# Patient Record
Sex: Male | Born: 1970 | Race: White | Hispanic: No | Smoking: Current every day smoker
Health system: Southern US, Community
[De-identification: ages and names within clinical notes are randomized; demographics above are authoritative.]

## PROBLEM LIST (undated history)

## (undated) DIAGNOSIS — J449 Chronic obstructive pulmonary disease, unspecified: Secondary | ICD-10-CM

## (undated) DIAGNOSIS — K089 Disorder of teeth and supporting structures, unspecified: Secondary | ICD-10-CM

## (undated) DIAGNOSIS — K219 Gastro-esophageal reflux disease without esophagitis: Secondary | ICD-10-CM

## (undated) DIAGNOSIS — M549 Dorsalgia, unspecified: Secondary | ICD-10-CM

## (undated) DIAGNOSIS — M8700 Idiopathic aseptic necrosis of unspecified bone: Secondary | ICD-10-CM

## (undated) DIAGNOSIS — J45909 Unspecified asthma, uncomplicated: Secondary | ICD-10-CM

## (undated) DIAGNOSIS — G8929 Other chronic pain: Secondary | ICD-10-CM

## (undated) DIAGNOSIS — I10 Essential (primary) hypertension: Secondary | ICD-10-CM

## (undated) DIAGNOSIS — F419 Anxiety disorder, unspecified: Secondary | ICD-10-CM

## (undated) DIAGNOSIS — N200 Calculus of kidney: Secondary | ICD-10-CM

## (undated) DIAGNOSIS — M719 Bursopathy, unspecified: Secondary | ICD-10-CM

## (undated) DIAGNOSIS — R51 Headache: Secondary | ICD-10-CM

## (undated) DIAGNOSIS — G709 Myoneural disorder, unspecified: Secondary | ICD-10-CM

## (undated) DIAGNOSIS — M1990 Unspecified osteoarthritis, unspecified site: Secondary | ICD-10-CM

## (undated) DIAGNOSIS — R519 Headache, unspecified: Secondary | ICD-10-CM

## (undated) DIAGNOSIS — C801 Malignant (primary) neoplasm, unspecified: Secondary | ICD-10-CM

## (undated) DIAGNOSIS — G43909 Migraine, unspecified, not intractable, without status migrainosus: Secondary | ICD-10-CM

## (undated) DIAGNOSIS — Z87442 Personal history of urinary calculi: Secondary | ICD-10-CM

## (undated) DIAGNOSIS — R7303 Prediabetes: Secondary | ICD-10-CM

## (undated) HISTORY — PX: WISDOM TOOTH EXTRACTION: SHX21

## (undated) HISTORY — PX: HERNIA REPAIR: SHX51

---

## 2002-02-03 ENCOUNTER — Inpatient Hospital Stay (HOSPITAL_COMMUNITY): Admission: EM | Admit: 2002-02-03 | Discharge: 2002-02-05 | Payer: Self-pay | Admitting: Emergency Medicine

## 2002-02-03 ENCOUNTER — Encounter: Payer: Self-pay | Admitting: Emergency Medicine

## 2003-12-14 ENCOUNTER — Emergency Department (HOSPITAL_COMMUNITY): Admission: EM | Admit: 2003-12-14 | Discharge: 2003-12-14 | Payer: Self-pay | Admitting: Emergency Medicine

## 2012-07-18 ENCOUNTER — Encounter (HOSPITAL_COMMUNITY): Payer: Self-pay | Admitting: *Deleted

## 2012-07-18 ENCOUNTER — Emergency Department (HOSPITAL_COMMUNITY)
Admission: EM | Admit: 2012-07-18 | Discharge: 2012-07-18 | Disposition: A | Discharge status: Home / Self Care | Payer: Self-pay | Attending: Emergency Medicine | Admitting: Emergency Medicine

## 2012-07-18 DIAGNOSIS — J020 Streptococcal pharyngitis: Secondary | ICD-10-CM | POA: Insufficient documentation

## 2012-07-18 DIAGNOSIS — R509 Fever, unspecified: Secondary | ICD-10-CM | POA: Insufficient documentation

## 2012-07-18 DIAGNOSIS — H9209 Otalgia, unspecified ear: Secondary | ICD-10-CM | POA: Insufficient documentation

## 2012-07-18 DIAGNOSIS — H9202 Otalgia, left ear: Secondary | ICD-10-CM

## 2012-07-18 DIAGNOSIS — IMO0001 Reserved for inherently not codable concepts without codable children: Secondary | ICD-10-CM | POA: Insufficient documentation

## 2012-07-18 DIAGNOSIS — R599 Enlarged lymph nodes, unspecified: Secondary | ICD-10-CM | POA: Insufficient documentation

## 2012-07-18 DIAGNOSIS — R1310 Dysphagia, unspecified: Secondary | ICD-10-CM | POA: Insufficient documentation

## 2012-07-18 LAB — RAPID STREP SCREEN (MED CTR MEBANE ONLY): Streptococcus, Group A Screen (Direct): NEGATIVE

## 2012-07-18 MED ORDER — ANTIPYRINE-BENZOCAINE 5.4-1.4 % OT SOLN: 3.0000 [drp] | OTIC | Status: DC | PRN

## 2012-07-18 MED ORDER — AMOXICILLIN 500 MG PO CAPS: 500.0000 mg | ORAL_CAPSULE | Freq: Three times a day (TID) | ORAL | Status: DC

## 2012-07-18 NOTE — ED Provider Notes (Signed)
History    This chart was scribed for non-physician practitioner working with Carleene Cooper III, MD by Sofie Rower, ED Scribe. This patient was seen in room TR09C/TR09C and the patient's care was started at 9:48PM.   CSN: [id number]  Arrival date & time 07/18/12  2107   First MD Initiated Contact with Patient 07/18/12 2148      Chief Complaint  Patient presents with  . Sore Throat    (Consider location/radiation/quality/duration/timing/severity/associated sxs/prior treatment) Patient is a 42 y.o. male presenting with pharyngitis. The history is provided by the patient. No language interpreter was used.  Sore Throat This is a new problem. The current episode started 2 days ago. The problem occurs constantly. The problem has been gradually worsening. Pertinent negatives include no chest pain, no headaches and no shortness of breath. Nothing aggravates the symptoms. Nothing relieves the symptoms. He has tried nothing for the symptoms. The treatment provided no relief.    Edward Kelley is a 42 y.o. male , with no known medical hx, who presents to the Emergency Department complaining of sudden, progressively worsening, sore throat, onset two days ago (07/16/12).  Pt chose not to speak during our discussion gesturing that the pain was too severe and speaking only in a whisper when prompted. He would nod yes and no and write on a piece of paper to answer questions. Associated symptoms include left sided otalgia, chills, subjective fever, and myalgias. The pt reports he has been experiencing a progressively worsening sore throat for the past two days, which has prompted his concern and desire to seek medical evaluation for possible strep throat at [facility] this evening (07/18/12). Pt took no interventions.   The pt is a current everyday smoker.   Pt does not have a PCP.    History reviewed. No pertinent past medical history.  No past surgical history on file.  No family history on file.  History   Substance Use Topics  . Smoking status: Not on file  . Smokeless tobacco: Not on file  . Alcohol Use: Not on file      Review of Systems  Constitutional: Negative for fever, chills and diaphoresis.  HENT: Positive for ear pain, sore throat and trouble swallowing. Negative for congestion, rhinorrhea, neck pain and neck stiffness.        Left sided ear pain  Eyes: Negative for visual disturbance.  Respiratory: Negative for apnea, chest tightness and shortness of breath.   Cardiovascular: Negative for chest pain and palpitations.  Gastrointestinal: Negative for nausea, vomiting, diarrhea and constipation.  Genitourinary: Negative for dysuria.  Musculoskeletal: Negative for gait problem.  Skin: Negative for rash.  Neurological: Negative for dizziness, weakness, light-headedness, numbness and headaches.    Allergies  Review of patient's allergies indicates no known allergies.  Home Medications   Current Outpatient Rx  Name  Route  Sig  Dispense  Refill  . ibuprofen (ADVIL,MOTRIN) 200 MG tablet   Oral   Take 800 mg by mouth daily as needed for pain. For pain           BP 132/94  Pulse 100  Temp(Src) 98.7 F (37.1 C) (Oral)  Resp 20  SpO2 98%  Physical Exam  Nursing note and vitals reviewed. Constitutional: He is oriented to person, place, and time. He appears well-developed and well-nourished. No distress.  HENT:  Head: Normocephalic and atraumatic. No trismus in the jaw.  Right Ear: Tympanic membrane normal.  Left Ear: Tympanic membrane and ear canal normal. There is tenderness.  No drainage. Tympanic membrane is not injected, not erythematous, not retracted and not bulging.  Nose: Right sinus exhibits no maxillary sinus tenderness and no frontal sinus tenderness. Left sinus exhibits no maxillary sinus tenderness and no frontal sinus tenderness.  Mouth/Throat: No edematous. Oropharyngeal exudate, posterior oropharyngeal edema and posterior oropharyngeal erythema  present. No tonsillar abscesses.    Eyes: Conjunctivae and EOM are normal.  Neck: Normal range of motion. Neck supple.  No meningeal signs  Cardiovascular: Normal rate, regular rhythm and normal heart sounds.  Exam reveals no gallop and no friction rub.   No murmur heard. Pulmonary/Chest: Effort normal and breath sounds normal. No respiratory distress. He has no wheezes. He has no rales. He exhibits no tenderness.  Abdominal: Soft. Bowel sounds are normal. He exhibits no distension. There is no tenderness. There is no rebound and no guarding.  Musculoskeletal: Normal range of motion. He exhibits no edema and no tenderness.  Neurological: He is alert and oriented to person, place, and time. No cranial nerve deficit.  Skin: Skin is warm and dry. He is not diaphoretic. No erythema.    ED Course  Procedures (including critical care time)  DIAGNOSTIC STUDIES: Oxygen Saturation is 98% on room air, normal by my interpretation.    COORDINATION OF CARE:  9:55 PM- Treatment plan discussed with patient. Pt agrees with treatment.  Medications - No data to display     Results for orders placed during the hospital encounter of 07/18/12  RAPID STREP SCREEN      Result Value Range   Streptococcus, Group A Screen (Direct) NEGATIVE  NEGATIVE   No results found.    No results found.   1. Strep throat   2. Otalgia of left ear       MDM  Pt afebrile with tonsillar exudate, cervical lymphadenopathy, & dysphagia; clinical diagnosis of strep though rapid strep returned negative. Discussed importance of water rehydration. Presentation non concerning for PTA or infxn spread to soft tissue. No trismus or uvula deviation. Specific return precautions discussed. Pt able to drink water in ED without difficulty with intact air way. Recommended PCP follow up.   I personally performed the services described in this documentation, which was scribed in my presence. The recorded information has been  reviewed and is accurate.     Glade Nurse, PA-C 07/19/12 0022

## 2012-07-18 NOTE — ED Notes (Signed)
The pt has been ill for 2 days chills painful swallowing earache lt ear.  Pt not speaking writin info down on paper

## 2012-07-18 NOTE — ED Notes (Signed)
Pt states sore throat since Thursday, with chills and body aches and left side ear ache. Denies fever, N/V, generalized body aches.

## 2012-07-20 NOTE — ED Provider Notes (Signed)
Medical screening examination/treatment/procedure(s) were performed by non-physician practitioner and as supervising physician I was immediately available for consultation/collaboration.   Carleene Cooper III, MD 07/20/12 [phone]

## 2014-03-16 ENCOUNTER — Emergency Department (HOSPITAL_COMMUNITY)
Admission: EM | Admit: 2014-03-16 | Discharge: 2014-03-16 | Disposition: A | Discharge status: Home / Self Care | Payer: No Typology Code available for payment source | Attending: Emergency Medicine | Admitting: Emergency Medicine

## 2014-03-16 ENCOUNTER — Emergency Department (HOSPITAL_COMMUNITY): Payer: No Typology Code available for payment source

## 2014-03-16 ENCOUNTER — Encounter (HOSPITAL_COMMUNITY): Payer: Self-pay | Admitting: *Deleted

## 2014-03-16 DIAGNOSIS — Y998 Other external cause status: Secondary | ICD-10-CM | POA: Diagnosis not present

## 2014-03-16 DIAGNOSIS — M47812 Spondylosis without myelopathy or radiculopathy, cervical region: Secondary | ICD-10-CM

## 2014-03-16 DIAGNOSIS — S161XXA Strain of muscle, fascia and tendon at neck level, initial encounter: Secondary | ICD-10-CM | POA: Insufficient documentation

## 2014-03-16 DIAGNOSIS — S199XXA Unspecified injury of neck, initial encounter: Secondary | ICD-10-CM | POA: Diagnosis present

## 2014-03-16 DIAGNOSIS — S0990XA Unspecified injury of head, initial encounter: Secondary | ICD-10-CM | POA: Diagnosis not present

## 2014-03-16 DIAGNOSIS — Y9389 Activity, other specified: Secondary | ICD-10-CM | POA: Diagnosis not present

## 2014-03-16 DIAGNOSIS — Y92410 Unspecified street and highway as the place of occurrence of the external cause: Secondary | ICD-10-CM | POA: Diagnosis not present

## 2014-03-16 DIAGNOSIS — S4990XA Unspecified injury of shoulder and upper arm, unspecified arm, initial encounter: Secondary | ICD-10-CM | POA: Insufficient documentation

## 2014-03-16 DIAGNOSIS — Z792 Long term (current) use of antibiotics: Secondary | ICD-10-CM | POA: Insufficient documentation

## 2014-03-16 DIAGNOSIS — M542 Cervicalgia: Secondary | ICD-10-CM

## 2014-03-16 MED ORDER — IBUPROFEN 200 MG PO TABS: 600.0000 mg | ORAL_TABLET | Freq: Every day | ORAL | Status: DC | PRN

## 2014-03-16 MED ORDER — HYDROCODONE-ACETAMINOPHEN 5-325 MG PO TABS: 1.0000 | ORAL_TABLET | Freq: Four times a day (QID) | ORAL | Status: DC | PRN

## 2014-03-16 MED ORDER — DIAZEPAM 5 MG PO TABS: 5.0000 mg | ORAL_TABLET | Freq: Two times a day (BID) | ORAL | Status: DC
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
## 2014-03-16 NOTE — Discharge Instructions (Signed)
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
## 2015-10-12 ENCOUNTER — Emergency Department (HOSPITAL_COMMUNITY): Payer: Self-pay

## 2015-10-12 ENCOUNTER — Encounter (HOSPITAL_COMMUNITY): Payer: Self-pay

## 2015-10-12 ENCOUNTER — Observation Stay (HOSPITAL_COMMUNITY): Payer: Self-pay | Admitting: Anesthesiology

## 2015-10-12 ENCOUNTER — Observation Stay (HOSPITAL_COMMUNITY)
Admission: EM | Admit: 2015-10-12 | Discharge: 2015-10-13 | Disposition: A | Discharge status: Home / Self Care | Payer: Self-pay | Attending: General Surgery | Admitting: General Surgery

## 2015-10-12 ENCOUNTER — Encounter (HOSPITAL_COMMUNITY)
Admission: EM | Disposition: A | Discharge status: Home / Self Care | Payer: Self-pay | Source: Home / Self Care | Attending: Emergency Medicine

## 2015-10-12 DIAGNOSIS — K3580 Unspecified acute appendicitis: Principal | ICD-10-CM | POA: Insufficient documentation

## 2015-10-12 DIAGNOSIS — K219 Gastro-esophageal reflux disease without esophagitis: Secondary | ICD-10-CM | POA: Insufficient documentation

## 2015-10-12 DIAGNOSIS — K047 Periapical abscess without sinus: Secondary | ICD-10-CM | POA: Insufficient documentation

## 2015-10-12 DIAGNOSIS — M1990 Unspecified osteoarthritis, unspecified site: Secondary | ICD-10-CM | POA: Insufficient documentation

## 2015-10-12 DIAGNOSIS — K3589 Other acute appendicitis without perforation or gangrene: Secondary | ICD-10-CM

## 2015-10-12 DIAGNOSIS — J45909 Unspecified asthma, uncomplicated: Secondary | ICD-10-CM | POA: Insufficient documentation

## 2015-10-12 DIAGNOSIS — K029 Dental caries, unspecified: Secondary | ICD-10-CM | POA: Insufficient documentation

## 2015-10-12 DIAGNOSIS — F17210 Nicotine dependence, cigarettes, uncomplicated: Secondary | ICD-10-CM | POA: Insufficient documentation

## 2015-10-12 DIAGNOSIS — F419 Anxiety disorder, unspecified: Secondary | ICD-10-CM | POA: Insufficient documentation

## 2015-10-12 DIAGNOSIS — K429 Umbilical hernia without obstruction or gangrene: Secondary | ICD-10-CM | POA: Insufficient documentation

## 2015-10-12 HISTORY — DX: Other chronic pain: G89.29

## 2015-10-12 HISTORY — PX: LAPAROSCOPIC APPENDECTOMY: SHX408

## 2015-10-12 HISTORY — DX: Dorsalgia, unspecified: M54.9

## 2015-10-12 HISTORY — DX: Migraine, unspecified, not intractable, without status migrainosus: G43.909

## 2015-10-12 HISTORY — DX: Unspecified asthma, uncomplicated: J45.909

## 2015-10-12 HISTORY — DX: Bursopathy, unspecified: M71.9

## 2015-10-12 HISTORY — DX: Headache: R51

## 2015-10-12 HISTORY — DX: Anxiety disorder, unspecified: F41.9

## 2015-10-12 HISTORY — DX: Headache, unspecified: R51.9

## 2015-10-12 HISTORY — DX: Gastro-esophageal reflux disease without esophagitis: K21.9

## 2015-10-12 HISTORY — DX: Unspecified osteoarthritis, unspecified site: M19.90

## 2015-10-12 HISTORY — DX: Calculus of kidney: N20.0

## 2015-10-12 HISTORY — DX: Disorder of teeth and supporting structures, unspecified: K08.9

## 2015-10-12 LAB — URINE MICROSCOPIC-ADD ON

## 2015-10-12 LAB — URINALYSIS, ROUTINE W REFLEX MICROSCOPIC
Bilirubin Urine: NEGATIVE
Glucose, UA: NEGATIVE mg/dL
KETONES UR: NEGATIVE mg/dL
LEUKOCYTES UA: NEGATIVE
Nitrite: NEGATIVE
PH: 6 (ref 5.0–8.0)
Protein, ur: NEGATIVE mg/dL
Specific Gravity, Urine: 1.02 (ref 1.005–1.030)

## 2015-10-12 LAB — COMPREHENSIVE METABOLIC PANEL
ALBUMIN: 4.5 g/dL (ref 3.5–5.0)
ALK PHOS: 57 U/L (ref 38–126)
ALT: 35 U/L (ref 17–63)
ANION GAP: 14 (ref 5–15)
AST: 26 U/L (ref 15–41)
BUN: 7 mg/dL (ref 6–20)
CO2: 18 mmol/L — AB (ref 22–32)
Calcium: 9.7 mg/dL (ref 8.9–10.3)
Chloride: 105 mmol/L (ref 101–111)
Creatinine, Ser: 1.13 mg/dL (ref 0.61–1.24)
GFR calc Af Amer: >60 mL/min (ref >60)
GFR calc non Af Amer: >60 mL/min (ref >60)
GLUCOSE: 128 mg/dL — AB (ref 65–99)
Potassium: 4.1 mmol/L (ref 3.5–5.1)
Sodium: 137 mmol/L (ref 135–145)
Total Bilirubin: 0.6 mg/dL (ref 0.3–1.2)
Total Protein: 7.3 g/dL (ref 6.5–8.1)

## 2015-10-12 LAB — CBC WITH DIFFERENTIAL/PLATELET
BASOS PCT: 1 %
Basophils Absolute: 0.1 10*3/uL (ref 0.0–0.1)
EOS ABS: 0.3 10*3/uL (ref 0.0–0.7)
Eosinophils Relative: 3 %
HCT: 48.3 % (ref 39.0–52.0)
HEMOGLOBIN: 16.7 g/dL (ref 13.0–17.0)
Lymphocytes Relative: 20 %
Lymphs Abs: 1.7 10*3/uL (ref 0.7–4.0)
MCH: 29.8 pg (ref 26.0–34.0)
MCHC: 34.6 g/dL (ref 30.0–36.0)
MCV: 86.1 fL (ref 78.0–100.0)
MONOS PCT: 8 %
Monocytes Absolute: 0.7 10*3/uL (ref 0.1–1.0)
NEUTROS PCT: 68 %
Neutro Abs: 5.9 10*3/uL (ref 1.7–7.7)
Platelets: 219 10*3/uL (ref 150–400)
RBC: 5.61 MIL/uL (ref 4.22–5.81)
RDW: 13 % (ref 11.5–15.5)
WBC: 8.7 10*3/uL (ref 4.0–10.5)

## 2015-10-12 LAB — SURGICAL PCR SCREEN
MRSA, PCR: NEGATIVE
Staphylococcus aureus: NEGATIVE

## 2015-10-12 LAB — LIPASE, BLOOD: Lipase: 20 U/L (ref 11–51)

## 2015-10-12 SURGERY — APPENDECTOMY, LAPAROSCOPIC
Anesthesia: General | Site: Abdomen

## 2015-10-12 MED ORDER — HYDROMORPHONE HCL 1 MG/ML IJ SOLN
1.0000 mg | Freq: Once | INTRAMUSCULAR | Status: AC
  Administered 2015-10-12: 1 mg via INTRAVENOUS
  Filled 2015-10-12: qty 1

## 2015-10-12 MED ORDER — SODIUM CHLORIDE 0.9 % IJ SOLN
INTRAMUSCULAR | Status: AC
  Filled 2015-10-12: qty 10

## 2015-10-12 MED ORDER — SUFENTANIL CITRATE 50 MCG/ML IV SOLN
INTRAVENOUS | Status: DC | PRN
  Administered 2015-10-12 (×5): 10 ug via INTRAVENOUS

## 2015-10-12 MED ORDER — IPRATROPIUM BROMIDE 0.02 % IN SOLN
0.5000 mg | Freq: Once | RESPIRATORY_TRACT | Status: AC
  Administered 2015-10-12: 0.5 mg via RESPIRATORY_TRACT
  Filled 2015-10-12: qty 2.5

## 2015-10-12 MED ORDER — ROCURONIUM BROMIDE 50 MG/5ML IV SOLN
INTRAVENOUS | Status: AC
  Filled 2015-10-12: qty 1

## 2015-10-12 MED ORDER — BUPIVACAINE-EPINEPHRINE (PF) 0.25% -1:200000 IJ SOLN
INTRAMUSCULAR | Status: AC
  Filled 2015-10-12: qty 30

## 2015-10-12 MED ORDER — OXYCODONE HCL 5 MG PO TABS: 5.0000 mg | ORAL_TABLET | Freq: Once | ORAL | Status: DC | PRN

## 2015-10-12 MED ORDER — METRONIDAZOLE IN NACL 5-0.79 MG/ML-% IV SOLN
500.0000 mg | Freq: Once | INTRAVENOUS | Status: AC
  Administered 2015-10-12: 500 mg via INTRAVENOUS
  Filled 2015-10-12: qty 100

## 2015-10-12 MED ORDER — BUPIVACAINE-EPINEPHRINE 0.25% -1:200000 IJ SOLN
INTRAMUSCULAR | Status: DC | PRN
  Administered 2015-10-12: 20 mL

## 2015-10-12 MED ORDER — OXYCODONE HCL 5 MG/5ML PO SOLN: 5.0000 mg | Freq: Once | ORAL | Status: DC | PRN

## 2015-10-12 MED ORDER — LIDOCAINE 2% (20 MG/ML) 5 ML SYRINGE
INTRAMUSCULAR | Status: AC
  Filled 2015-10-12: qty 5

## 2015-10-12 MED ORDER — ONDANSETRON HCL 4 MG/2ML IJ SOLN: 4.0000 mg | Freq: Four times a day (QID) | INTRAMUSCULAR | Status: DC | PRN

## 2015-10-12 MED ORDER — NICOTINE 21 MG/24HR TD PT24
21.0000 mg | MEDICATED_PATCH | Freq: Every day | TRANSDERMAL | Status: DC
  Administered 2015-10-13: 21 mg via TRANSDERMAL
  Filled 2015-10-12: qty 1

## 2015-10-12 MED ORDER — SODIUM CHLORIDE 0.9 % IR SOLN
Status: DC | PRN
  Administered 2015-10-12: 1000 mL

## 2015-10-12 MED ORDER — PROPOFOL 10 MG/ML IV BOLUS
INTRAVENOUS | Status: AC
  Filled 2015-10-12: qty 20

## 2015-10-12 MED ORDER — SUGAMMADEX SODIUM 200 MG/2ML IV SOLN
INTRAVENOUS | Status: DC | PRN
  Administered 2015-10-12: 200 mg via INTRAVENOUS

## 2015-10-12 MED ORDER — IPRATROPIUM-ALBUTEROL 20-100 MCG/ACT IN AERS
INHALATION_SPRAY | RESPIRATORY_TRACT | Status: DC | PRN
  Administered 2015-10-12 (×2): 2 via RESPIRATORY_TRACT

## 2015-10-12 MED ORDER — PNEUMOCOCCAL VAC POLYVALENT 25 MCG/0.5ML IJ INJ: 0.5000 mL | INJECTION | INTRAMUSCULAR | Status: DC

## 2015-10-12 MED ORDER — DEXTROSE 5 % IV SOLN
2.0000 g | Freq: Once | INTRAVENOUS | Status: AC
  Administered 2015-10-12: 2 g via INTRAVENOUS
  Filled 2015-10-12: qty 2

## 2015-10-12 MED ORDER — SODIUM CHLORIDE 0.9 % IV SOLN
INTRAVENOUS | Status: DC
Start: 2015-10-12 — End: 2015-10-13
  Administered 2015-10-12 – 2015-10-13 (×2): via INTRAVENOUS

## 2015-10-12 MED ORDER — MIDAZOLAM HCL 2 MG/2ML IJ SOLN
INTRAMUSCULAR | Status: DC | PRN
  Administered 2015-10-12: 2 mg via INTRAVENOUS

## 2015-10-12 MED ORDER — 0.9 % SODIUM CHLORIDE (POUR BTL) OPTIME
TOPICAL | Status: DC | PRN
  Administered 2015-10-12: 200 mL

## 2015-10-12 MED ORDER — IPRATROPIUM-ALBUTEROL 0.5-2.5 (3) MG/3ML IN SOLN: 3.0000 mL | Freq: Four times a day (QID) | RESPIRATORY_TRACT | Status: DC | PRN

## 2015-10-12 MED ORDER — ROCURONIUM BROMIDE 50 MG/5ML IV SOLN
INTRAVENOUS | Status: AC
Start: 2015-10-12 — End: 2015-10-12
  Filled 2015-10-12: qty 1

## 2015-10-12 MED ORDER — LACTATED RINGERS IV SOLN
INTRAVENOUS | Status: DC | PRN
  Administered 2015-10-12 (×3): via INTRAVENOUS

## 2015-10-12 MED ORDER — ROCURONIUM BROMIDE 100 MG/10ML IV SOLN
INTRAVENOUS | Status: DC | PRN
  Administered 2015-10-12: 30 mg via INTRAVENOUS
  Administered 2015-10-12: 20 mg via INTRAVENOUS

## 2015-10-12 MED ORDER — ONDANSETRON 4 MG PO TBDP: 4.0000 mg | ORAL_TABLET | Freq: Four times a day (QID) | ORAL | Status: DC | PRN

## 2015-10-12 MED ORDER — ALBUTEROL SULFATE (2.5 MG/3ML) 0.083% IN NEBU
5.0000 mg | INHALATION_SOLUTION | Freq: Once | RESPIRATORY_TRACT | Status: AC
  Administered 2015-10-12: 5 mg via RESPIRATORY_TRACT
  Filled 2015-10-12: qty 6

## 2015-10-12 MED ORDER — SUCCINYLCHOLINE CHLORIDE 20 MG/ML IJ SOLN
INTRAMUSCULAR | Status: DC | PRN
  Administered 2015-10-12: 120 mg via INTRAVENOUS

## 2015-10-12 MED ORDER — ALBUTEROL SULFATE HFA 108 (90 BASE) MCG/ACT IN AERS
INHALATION_SPRAY | RESPIRATORY_TRACT | Status: AC
  Filled 2015-10-12: qty 6.7

## 2015-10-12 MED ORDER — LIDOCAINE HCL (CARDIAC) 20 MG/ML IV SOLN
INTRAVENOUS | Status: DC | PRN
  Administered 2015-10-12: 60 mg via INTRATRACHEAL

## 2015-10-12 MED ORDER — SUGAMMADEX SODIUM 200 MG/2ML IV SOLN
INTRAVENOUS | Status: AC
  Filled 2015-10-12: qty 2

## 2015-10-12 MED ORDER — SUFENTANIL CITRATE 50 MCG/ML IV SOLN
INTRAVENOUS | Status: AC
  Filled 2015-10-12: qty 1

## 2015-10-12 MED ORDER — HYDROMORPHONE HCL 1 MG/ML IJ SOLN
1.0000 mg | INTRAMUSCULAR | Status: DC | PRN
  Administered 2015-10-12 (×2): 1 mg via INTRAVENOUS
  Filled 2015-10-12 (×2): qty 1

## 2015-10-12 MED ORDER — SUCCINYLCHOLINE CHLORIDE 200 MG/10ML IV SOSY
PREFILLED_SYRINGE | INTRAVENOUS | Status: AC
  Filled 2015-10-12: qty 10

## 2015-10-12 MED ORDER — MIDAZOLAM HCL 2 MG/2ML IJ SOLN
INTRAMUSCULAR | Status: AC
  Filled 2015-10-12: qty 2

## 2015-10-12 MED ORDER — ONDANSETRON HCL 4 MG/2ML IJ SOLN
INTRAMUSCULAR | Status: DC | PRN
  Administered 2015-10-12: 4 mg via INTRAVENOUS

## 2015-10-12 MED ORDER — ONDANSETRON HCL 4 MG/2ML IJ SOLN
4.0000 mg | Freq: Once | INTRAMUSCULAR | Status: AC
  Administered 2015-10-12: 4 mg via INTRAVENOUS
  Filled 2015-10-12: qty 2

## 2015-10-12 MED ORDER — HYDROMORPHONE HCL 1 MG/ML IJ SOLN
0.2500 mg | INTRAMUSCULAR | Status: DC | PRN
  Administered 2015-10-12 (×2): 0.5 mg via INTRAVENOUS

## 2015-10-12 MED ORDER — HYDROMORPHONE HCL 1 MG/ML IJ SOLN
INTRAMUSCULAR | Status: AC
  Administered 2015-10-12: 0.5 mg via INTRAVENOUS
  Filled 2015-10-12: qty 1

## 2015-10-12 MED ORDER — PROPOFOL 10 MG/ML IV BOLUS
INTRAVENOUS | Status: DC | PRN
  Administered 2015-10-12: 200 mg via INTRAVENOUS

## 2015-10-12 MED ORDER — HYDROMORPHONE HCL 1 MG/ML IJ SOLN
0.5000 mg | Freq: Once | INTRAMUSCULAR | Status: AC
  Administered 2015-10-12: 0.5 mg via INTRAVENOUS
  Filled 2015-10-12: qty 1

## 2015-10-12 MED ORDER — ONDANSETRON HCL 4 MG/2ML IJ SOLN
INTRAMUSCULAR | Status: AC
  Filled 2015-10-12: qty 2

## 2015-10-12 SURGICAL SUPPLY — 59 items
ADH SKN CLS APL DERMABOND .7 (GAUZE/BANDAGES/DRESSINGS)
APL SKNCLS STERI-STRIP NONHPOA (GAUZE/BANDAGES/DRESSINGS) ×1
APPLIER CLIP 5 13 M/L LIGAMAX5 (MISCELLANEOUS) ×3
APPLIER CLIP ROT 10 11.4 M/L (STAPLE)
APR CLP MED LRG 11.4X10 (STAPLE)
APR CLP MED LRG 5 ANG JAW (MISCELLANEOUS) ×1
BAG SPEC RTRVL 10 TROC 200 (ENDOMECHANICALS) ×1
BANDAGE ADH SHEER 1  50/CT (GAUZE/BANDAGES/DRESSINGS) IMPLANT
BENZOIN TINCTURE PRP APPL 2/3 (GAUZE/BANDAGES/DRESSINGS) ×2 IMPLANT
BLADE SURG ROTATE 9660 (MISCELLANEOUS) IMPLANT
CANISTER SUCTION 2500CC (MISCELLANEOUS) ×3 IMPLANT
CHLORAPREP W/TINT 26ML (MISCELLANEOUS) ×3 IMPLANT
CLIP APPLIE 5 13 M/L LIGAMAX5 (MISCELLANEOUS) IMPLANT
CLIP APPLIE ROT 10 11.4 M/L (STAPLE) IMPLANT
CLOSURE WOUND 1/2 X4 (GAUZE/BANDAGES/DRESSINGS)
COVER SURGICAL LIGHT HANDLE (MISCELLANEOUS) ×3 IMPLANT
CUTTER FLEX LINEAR 45M (STAPLE) ×3 IMPLANT
DERMABOND ADVANCED (GAUZE/BANDAGES/DRESSINGS)
DERMABOND ADVANCED .7 DNX12 (GAUZE/BANDAGES/DRESSINGS) IMPLANT
DRSG TEGADERM 4X4.75 (GAUZE/BANDAGES/DRESSINGS) ×2 IMPLANT
ELECT REM PT RETURN 9FT ADLT (ELECTROSURGICAL) ×3
ELECTRODE REM PT RTRN 9FT ADLT (ELECTROSURGICAL) ×1 IMPLANT
ENDOLOOP SUT PDS II  0 18 (SUTURE)
ENDOLOOP SUT PDS II 0 18 (SUTURE) IMPLANT
GAUZE SPONGE 2X2 8PLY STRL LF (GAUZE/BANDAGES/DRESSINGS) IMPLANT
GLOVE BIOGEL M STRL SZ7.5 (GLOVE) ×3 IMPLANT
GLOVE BIOGEL PI IND STRL 8 (GLOVE) ×1 IMPLANT
GLOVE BIOGEL PI INDICATOR 8 (GLOVE) ×2
GOWN STRL REUS W/ TWL LRG LVL3 (GOWN DISPOSABLE) ×2 IMPLANT
GOWN STRL REUS W/ TWL XL LVL3 (GOWN DISPOSABLE) ×1 IMPLANT
GOWN STRL REUS W/TWL LRG LVL3 (GOWN DISPOSABLE) ×6
GOWN STRL REUS W/TWL XL LVL3 (GOWN DISPOSABLE) ×3
KIT BASIN OR (CUSTOM PROCEDURE TRAY) ×3 IMPLANT
KIT ROOM TURNOVER OR (KITS) ×3 IMPLANT
NS IRRIG 1000ML POUR BTL (IV SOLUTION) ×3 IMPLANT
PAD ARMBOARD 7.5X6 YLW CONV (MISCELLANEOUS) ×6 IMPLANT
POUCH RETRIEVAL ECOSAC 10 (ENDOMECHANICALS) ×1 IMPLANT
POUCH RETRIEVAL ECOSAC 10MM (ENDOMECHANICALS) ×2
RELOAD 45 VASCULAR/THIN (ENDOMECHANICALS) IMPLANT
RELOAD STAPLE 45 2.5 WHT GRN (ENDOMECHANICALS) IMPLANT
RELOAD STAPLE 45 3.5 BLU ETS (ENDOMECHANICALS) IMPLANT
RELOAD STAPLE TA45 3.5 REG BLU (ENDOMECHANICALS) ×3 IMPLANT
SCALPEL HARMONIC ACE (MISCELLANEOUS) ×3 IMPLANT
SCISSORS LAP 5X35 DISP (ENDOMECHANICALS) IMPLANT
SET IRRIG TUBING LAPAROSCOPIC (IRRIGATION / IRRIGATOR) ×3 IMPLANT
SPECIMEN JAR SMALL (MISCELLANEOUS) ×3 IMPLANT
SPONGE GAUZE 2X2 STER 10/PKG (GAUZE/BANDAGES/DRESSINGS) ×2
STRIP CLOSURE SKIN 1/2X4 (GAUZE/BANDAGES/DRESSINGS) IMPLANT
SUT MNCRL AB 4-0 PS2 18 (SUTURE) ×3 IMPLANT
SUT NOVA NAB GS-21 1 T12 (SUTURE) ×2 IMPLANT
SUT VIC AB 3-0 SH 8-18 (SUTURE) ×2 IMPLANT
SUT VICRYL 0 UR6 27IN ABS (SUTURE) IMPLANT
TOWEL OR 17X24 6PK STRL BLUE (TOWEL DISPOSABLE) ×3 IMPLANT
TOWEL OR 17X26 10 PK STRL BLUE (TOWEL DISPOSABLE) ×3 IMPLANT
TRAY FOLEY CATH 16FR SILVER (SET/KITS/TRAYS/PACK) ×3 IMPLANT
TRAY LAPAROSCOPIC MC (CUSTOM PROCEDURE TRAY) ×3 IMPLANT
TROCAR XCEL BLADELESS 5X75MML (TROCAR) ×6 IMPLANT
TROCAR XCEL BLUNT TIP 100MML (ENDOMECHANICALS) ×3 IMPLANT
TUBING INSUFFLATION (TUBING) ×3 IMPLANT

## 2015-10-12 NOTE — Anesthesia Procedure Notes (Signed)
Procedure Name: Intubation Date/Time: 10/12/2015 8:35 PM Performed by: Madai Nuccio S Pre-anesthesia Checklist: Patient identified, Suction available, Emergency Drugs available, Patient being monitored and Timeout performed Patient Re-evaluated:Patient Re-evaluated prior to inductionOxygen Delivery Method: Circle system utilized Preoxygenation: Pre-oxygenation with 100% oxygen Intubation Type: IV induction Ventilation: Mask ventilation without difficulty Laryngoscope Size: Mac and 4 Grade View: Grade I Tube type: Oral Tube size: 7.5 mm Number of attempts: 1 Airway Equipment and Method: Stylet Placement Confirmation: ETT inserted through vocal cords under direct vision,  positive ETCO2 and breath sounds checked- equal and bilateral Secured at: 22 cm Tube secured with: Tape Dental Injury: Teeth and Oropharynx as per pre-operative assessment

## 2015-10-12 NOTE — Transfer of Care (Signed)
Immediate Anesthesia Transfer of Care Note  Patient: Edward Kelley  Procedure(s) Performed: Procedure(s): APPENDECTOMY LAPAROSCOPIC (N/A)  Patient Location: PACU  Anesthesia Type:General  Level of Consciousness: awake, alert  and oriented  Airway & Oxygen Therapy: Patient Spontanous Breathing and Patient connected to face mask oxygen  Post-op Assessment: Report given to RN and Post -op Vital signs reviewed and stable  Post vital signs: Reviewed and stable  Last Vitals:  Vitals:   10/12/15 1719 10/12/15 1956  BP: (!) 153/98 (!) 143/88  Pulse: 85 72  Resp: 18 20  Temp: 36.6 C 37.1 C    Last Pain:  Vitals:   10/12/15 1956  TempSrc: Oral  PainSc:       Patients Stated Pain Goal: 2 (10/12/15 1719)  Complications: No apparent anesthesia complications

## 2015-10-12 NOTE — ED Notes (Signed)
Pt transported to xray 

## 2015-10-12 NOTE — ED Triage Notes (Signed)
Pt c/o right-sided abd pain x 2 days, left sided dental pain x years, and shortness of breath for years but much worse today. Denies n/v/d.

## 2015-10-12 NOTE — Progress Notes (Signed)
Admitted to room 6n23 from ED, denies nausea, c/o abd pain, oriented to room and surroundings, no family with pt.Marland Kitchen

## 2015-10-12 NOTE — Op Note (Signed)
Jaden Abreu [id number] [birth year] 10/12/2015  Appendectomy, Lap, Procedure Note  Indications: The patient presented with a history of right-sided abdominal pain. A CT revealed findings consistent with acute appendicitis.  Pre-operative Diagnosis: Acute appendicitis  Post-operative Diagnosis: Same  Surgeon: Atilano Ina   Assistants: None  Anesthesia: General endotracheal anesthesia  Procedure Details  The patient was seen again in the Holding Room. The risks, benefits, complications, treatment options, and expected outcomes were discussed with the patient and/or family. The possibilities of perforation of viscus, bleeding, recurrent infection, the need for additional procedures, failure to diagnose a condition, and creating a complication requiring transfusion or operation were discussed. There was concurrence with the proposed plan and informed consent was obtained. The site of surgery was properly noted. The patient was taken to Operating Room, identified as Tavin Vernet and the procedure verified as Appendectomy. A Time Out was held and the above information confirmed.  The patient was placed in the supine position and general anesthesia was induced, along with placement of orogastric tube, SCDs, and a Foley catheter. The abdomen was prepped and draped in a sterile fashion. He had a small fascial defect at his umbilicus so a curvilinear infraumbilical incision was made.   The umbilical stalk was elevated, and and lifted off the fascia. This exposed a 1 cm fascial defect. It was slightly enlarged.  A Kelly clamp was used to confirm entrance into the peritoneal cavity.  A pursestring suture was passed around the incision with a 0 Vicryl.  A 12mm Hasson was introduced into the abdomen and the tails of the suture were used to hold the Hasson in place.   The pneumoperitoneum was then established to steady pressure of 15 mmHg.  Additional 5 mm cannulas then placed in the left lower quadrant of  the abdomen and the suprapubic region under direct visualization. A careful evaluation of the entire abdomen was carried out. The patient was placed in Trendelenburg and left lateral decubitus position. The small intestines were retracted in the cephalad and left lateral direction away from the pelvis and right lower quadrant. The patient was found to have an severely inflamed appendix that was retrocecal in nature. It was extending up the right paracolic gutter. There was no evidence of perforation.   some of the overlying peritoneum was incised with harmonic scalpel. I decided to dissect out the appendiceal base first since that was readily accessible. The appendix was divided at its base using an endo-GIA stapler with a blue load. No appendiceal stump was left in place. There was one spot of bleeding along the staple line. It did not respond to harmonic scalpel. Therefore one 5 mm ligament clip was applied with excellent hemostasis.  The appendix was carefully dissected. The appendix was was skeletonized with the harmonic scalpel.    The appendix was removed from the abdomen with an Ecco bag through the umbilical port.  There was no evidence of bleeding, leakage, or complication after division of the appendix. Irrigation was also performed and irrigate suctioned from the abdomen as well.   because he had a pre-existing small umbilical hernia I decided to close the fascia transversely with 3 interrupted #1 Novafil sutures.  The closure was viewed laparoscopically. There was no residual palpable fascial defect.  The umbilical stalk was tacked back down to the fascia with a 3-0 Vicryl suture. The deep dermis of the umbilical incision was closed with interrupted 3-0 Vicryl sutures. All skin wounds were closed with 4-0 Monocryl. Benzoin, Steri-Strips, and sterile  bandages were applied to the skin incisions.  Instrument, sponge, and needle counts were correct at the conclusion of the case.   Findings: The  appendix was found to be inflamed. There were not signs of necrosis.  There was not perforation. There was not abscess formation. It was retrocecal in nature  Estimated Blood Loss:  20cc         Drains: none         Specimens: appendix         Complications:  None; patient tolerated the procedure well.         Disposition: PACU - hemodynamically stable.         Condition: stable  Mary Sella. Andrey Campanile, MD, FACS General, Bariatric, & Minimally Invasive Surgery [facility], [place]   Mary Sella. Andrey Campanile, MD, FACS General, Bariatric, & Minimally Invasive Surgery [facility], [place]

## 2015-10-12 NOTE — ED Provider Notes (Signed)
[facility]-EMERGENCY DEPT Provider Note   CSN: [id number] Arrival date & time: 10/12/15  1103  First Provider Contact:  None       History   Chief Complaint No chief complaint on file.   HPI Edward Kelley is a 45 y.o. male.  The history is provided by the patient.  Abdominal Pain   This is a new problem. The current episode started yesterday. The problem occurs constantly. The problem has been gradually worsening. Associated with: Started spontaneously was not related to eating. The pain is located in the RUQ (Pain radiates around to the right back and right lower abdomen). The quality of the pain is sharp and shooting. The pain is at a severity of 8/10. The pain is severe. Associated symptoms include anorexia. Pertinent negatives include diarrhea, nausea, vomiting and hematuria. Associated symptoms comments: Mild urinary frequency. Patient has a chronic cough which is unchanged but feels more short of breath and pain is worse with deep breathing.. Nothing aggravates the symptoms. Nothing relieves the symptoms. Past medical history comments: No history of gallstones or kidney stones.    Past Medical History:  Diagnosis Date  . Asthma     There are no active problems to display for this patient.   No past surgical history on file.     Home Medications    Prior to Admission medications   Medication Sig Start Date End Date Taking? Authorizing Provider  amoxicillin (AMOXIL) 500 MG capsule Take 1 capsule (500 mg total) by mouth 3 (three) times daily. 07/18/12   Lowell Bouton, PA-C  antipyrine-benzocaine Lyla Son) otic solution Place 3 drops into the left ear every 2 (two) hours as needed for pain. 07/18/12   Lowell Bouton, PA-C  diazepam (VALIUM) 5 MG tablet Take 1 tablet (5 mg total) by mouth 2 (two) times daily. 03/16/14   [name], PA-C  HYDROcodone-acetaminophen (NORCO/VICODIN) 5-325 MG per tablet Take 1-2 tablets by mouth every 6 (six) hours as needed for moderate pain or  severe pain. 03/16/14   [name], PA-C  ibuprofen (ADVIL,MOTRIN) 200 MG tablet Take 3 tablets (600 mg total) by mouth daily as needed. For pain 03/16/14   [name], PA-C    Family History No family history on file.  Social History Social History  Substance Use Topics  . Smoking status: Current Every Day Smoker    Packs/day: 1.00    Types: Cigarettes  . Smokeless tobacco: Never Used  . Alcohol use Yes     Comment: states when he can afford to drink     Allergies   Codeine   Review of Systems Review of Systems  HENT: Positive for dental problem.        Long history of dental caries in several days ago started getting left cheek swelling from a bad left upper tooth  Gastrointestinal: Positive for abdominal pain and anorexia. Negative for diarrhea, nausea and vomiting.  Genitourinary: Negative for hematuria.     Physical Exam Updated Vital Signs BP (!) 153/112 (BP Location: Right Arm)   Pulse 94   Temp 98.5 F (36.9 C) (Oral)   Resp 20   SpO2 98%   Physical Exam  Constitutional: He is oriented to person, place, and time. He appears well-developed and well-nourished. He appears distressed.  Appears uncomfortable  HENT:  Head: Normocephalic and atraumatic.  Mouth/Throat: Oropharynx is clear and moist. Dental abscesses and dental caries present.    Eyes: Conjunctivae and EOM are normal. Pupils are equal, round, and reactive  to light.  Neck: Normal range of motion. Neck supple.  Cardiovascular: Regular rhythm and intact distal pulses.  Tachycardia present.   No murmur heard. Pulmonary/Chest: Effort normal. No respiratory distress. He has wheezes in the right middle field and the right lower field. He has no rales.  Abdominal: Soft. He exhibits no distension. There is tenderness in the right upper quadrant. There is guarding and CVA tenderness. There is no rebound.  Musculoskeletal: Normal range of motion. He exhibits no edema or tenderness.  Neurological: He is  alert and oriented to person, place, and time.  Skin: Skin is warm and dry. No rash noted. No erythema.  Psychiatric: He has a normal mood and affect. His behavior is normal.  Nursing note and vitals reviewed.    ED Treatments / Results  Labs (all labs ordered are listed, but only abnormal results are displayed) Labs Reviewed  COMPREHENSIVE METABOLIC PANEL - Abnormal; Notable for the following:       Result Value   CO2 18 (*)    Glucose, Bld 128 (*)    All other components within normal limits  URINALYSIS, ROUTINE W REFLEX MICROSCOPIC (NOT AT [facility]) - Abnormal; Notable for the following:    Hgb urine dipstick SMALL (*)    All other components within normal limits  URINE MICROSCOPIC-ADD ON - Abnormal; Notable for the following:    Squamous Epithelial / LPF 0-5 (*)    Bacteria, UA FEW (*)    All other components within normal limits  CBC WITH DIFFERENTIAL/PLATELET  LIPASE, BLOOD    EKG  EKG Interpretation  Date/Time:  Thursday October 12 2015 11:13:01 EDT Ventricular Rate:  97 PR Interval:    QRS Duration: 90 QT Interval:  323 QTC Calculation: 411 R Axis:   99 Text Interpretation:  Sinus rhythm Borderline right axis deviation No previous tracing Confirmed by Anitra Lauth  MD, [place] Kievit ([id number]) on 10/12/2015 11:40:38 AM       Radiology Dg Chest 2 View  Result Date: 10/12/2015 CLINICAL DATA:  Shortness of breath, upper abdominal pain EXAM: CHEST  2 VIEW COMPARISON:  None. FINDINGS: Lungs are clear.  No pleural effusion or pneumothorax. The heart is normal in size. Visualized osseous structures are within normal limits. IMPRESSION: No evidence of acute cardiopulmonary disease. Electronically Signed   By: Charline Bills M.D.   On: 10/12/2015 12:49   Ct Renal Stone Study  Result Date: 10/12/2015 CLINICAL DATA:  Initial evaluation for acute right-sided right flank pain since last night. History of gallstones. EXAM: CT ABDOMEN AND PELVIS WITHOUT CONTRAST TECHNIQUE: Multidetector CT  imaging of the abdomen and pelvis was performed following the standard protocol without IV contrast. COMPARISON:  None. FINDINGS: Minimal subsegmental atelectasis seen dependently within the visualized lung bases. 4 mm sub solid nodule present within the right lower lobe (series 4, image 2), indeterminate. Additional 4 mm left lower lobe nodule noted (series 4, image 10). Visualized lungs are otherwise clear. Limited noncontrast evaluation of the liver is unremarkable. Gallbladder within normal limits. No biliary dilatation. Spleen, right adrenal gland, and pancreas demonstrate a normal unenhanced appearance. 6 mm hypodense nodule arising from the left adrenal gland noted (series 2, image 26), indeterminate, but of doubtful clinical significance, and possibly reflecting a small adenoma. Kidneys equal in size without hydronephrosis. Punctate nonobstructive calculus within the lower pole of the right kidney (series 5, image 54). Additional faint calcific densities overlying the renal papilla of both kidneys also suspicious for possible tiny nonobstructive calculi. No radiopaque calculi seen  along the course of either ureter. There is no hydroureter. Stomach within normal limits. No evidence for bowel obstruction. Appendix well visualized in the right lower quadrant, and is retrocecal in location. Appendix is dilated up to 17 mm in diameter with wall thickening and periappendiceal fat stranding, concerning for acute appendicitis. No periappendiceal abscess or evidence for perforation. No other acute inflammatory changes about the bowels. Bladder within normal limits. No layering stones present within the bladder lumen. Prostate normal. No free air or fluid. No pathologically enlarged intra-abdominal or pelvic lymph nodes. Mild aorto bi-iliac atherosclerotic disease. No aneurysm. No acute osseous abnormality. No worrisome lytic or blastic osseous lesions. Few scattered degenerative endplate Schmorl's nodes noted.  IMPRESSION: 1. Findings consistent with acute appendicitis. No evidence for perforation or other complication. 2. Faint punctate calcifications scattered throughout both kidneys, suspicious for possible small nonobstructive renal calculi. No CT evidence for obstructive uropathy. 3. Two 4 mm nodules present within the visualized lung bases as above, indeterminate. No follow-up needed if patient is low-risk (and has no known or suspected primary neoplasm). Non-contrast chest CT can be considered in 12 months if patient is high-risk. This recommendation follows the consensus statement: Guidelines for Management of Incidental Pulmonary Nodules Detected on CT Images:From the Fleischner Society 2017; published online before print (10.1148/[id number]). Electronically Signed   By: Rise Mu M.D.   On: 10/12/2015 13:50    Procedures Procedures (including critical care time)  Medications Ordered in ED Medications  HYDROmorphone (DILAUDID) injection 1 mg (not administered)  ondansetron (ZOFRAN) injection 4 mg (not administered)  0.9 %  sodium chloride infusion (not administered)  albuterol (PROVENTIL) (2.5 MG/3ML) 0.083% nebulizer solution 5 mg (not administered)  ipratropium (ATROVENT) nebulizer solution 0.5 mg (not administered)     Initial Impression / Assessment and Plan / ED Course  I have reviewed the triage vital signs and the nursing notes.  Pertinent labs & imaging results that were available during my care of the patient were reviewed by me and considered in my medical decision making (see chart for details).  Clinical Course   Pt with symptoms concerning for kidney stone on the right with severe pain in the right abd that radiates to the back.  Also concern for possible cholecystitis vs lung pathology.  Patient is having some shortness of breath and has a chronic cough with a history of ongoing tobacco abuse and is supposed to use inhaler but doesn't have the money for it.   Denies infectious sx, and no fever, vomiting or bowel changes.  Low concern for diverticulitis and no risk factors or history suggestive of AAA.  No hx suggestive of GU source (discharge) and otherwise pt is healthy.   Will hydrate, treat pain and ensure no infection with UA, CBC, CMP.  Vision does have wheezing on exam and he was given albuterol and Atrovent.  Secondly patient has swelling and left upper dental pain. He states he has multiple dental caries and needs to see a dentist but doesn't. He started getting facial swelling several days ago and states this is not unusual when one of his teeth starts acting up. That is not why he is here today.  2:10 PM CT is consistent with acute appendicitis. Patients are on antibiotics and surgery consult.   Final Clinical Impressions(s) / ED Diagnoses   Final diagnoses:  Other acute appendicitis  Dental abscess    New Prescriptions New Prescriptions   No medications on file     Gwyneth Sprout, MD 10/12/15  1412  

## 2015-10-12 NOTE — H&P (Signed)
Edward Kelley is an 45 y.o. male.   Chief Complaint: Appendicitis HPI: Edward Kelley was in his usual state of health which included battling chronic dental disease. He went to sleep last night and was awoken at about 0200 with severe right back pain. The pain continued to progress and he came to the ED about 1000. He denies any N/V/fevers. He takes ibuprofen often for his dental disease. A CT scan was positive for an acute retrocecal appendicitis.  Past Medical History:  Diagnosis Date  . Asthma     No past surgical history on file.  No family history on file. Social History:  reports that he has been smoking Cigarettes.  He has been smoking about 1.00 pack per day. He has never used smokeless tobacco. He reports that he drinks alcohol. He reports that he does not use drugs.  Allergies:  Allergies  Allergen Reactions  . Codeine Itching    Results for orders placed or performed during the hospital encounter of 10/12/15 (from the past 48 hour(s))  CBC with Differential/Platelet     Status: None   Collection Time: 10/12/15 11:20 AM  Result Value Ref Range   WBC 8.7 4.0 - 10.5 K/uL   RBC 5.61 4.22 - 5.81 MIL/uL   Hemoglobin 16.7 13.0 - 17.0 g/dL   HCT 48.3 39.0 - 52.0 %   MCV 86.1 78.0 - 100.0 fL   MCH 29.8 26.0 - 34.0 pg   MCHC 34.6 30.0 - 36.0 g/dL   RDW 13.0 11.5 - 15.5 %   Platelets 219 150 - 400 K/uL   Neutrophils Relative % 68 %   Neutro Abs 5.9 1.7 - 7.7 K/uL   Lymphocytes Relative 20 %   Lymphs Abs 1.7 0.7 - 4.0 K/uL   Monocytes Relative 8 %   Monocytes Absolute 0.7 0.1 - 1.0 K/uL   Eosinophils Relative 3 %   Eosinophils Absolute 0.3 0.0 - 0.7 K/uL   Basophils Relative 1 %   Basophils Absolute 0.1 0.0 - 0.1 K/uL  Comprehensive metabolic panel     Status: Abnormal   Collection Time: 10/12/15 11:20 AM  Result Value Ref Range   Sodium 137 135 - 145 mmol/L   Potassium 4.1 3.5 - 5.1 mmol/L   Chloride 105 101 - 111 mmol/L   CO2 18 (L) 22 - 32 mmol/L   Glucose, Bld 128 (H) 65  - 99 mg/dL   BUN 7 6 - 20 mg/dL   Creatinine, Ser 1.13 0.61 - 1.24 mg/dL   Calcium 9.7 8.9 - 10.3 mg/dL   Total Protein 7.3 6.5 - 8.1 g/dL   Albumin 4.5 3.5 - 5.0 g/dL   AST 26 15 - 41 U/L   ALT 35 17 - 63 U/L   Alkaline Phosphatase 57 38 - 126 U/L   Total Bilirubin 0.6 0.3 - 1.2 mg/dL   GFR calc non Af Amer >60 >60 mL/min   GFR calc Af Amer >60 >60 mL/min    Comment: (NOTE) The eGFR has been calculated using the CKD EPI equation. This calculation has not been validated in all clinical situations. eGFR's persistently <60 mL/min signify possible Chronic Kidney Disease.    Anion gap 14 5 - 15  Lipase, blood     Status: None   Collection Time: 10/12/15 11:20 AM  Result Value Ref Range   Lipase 20 11 - 51 U/L  Urinalysis, Routine w reflex microscopic (not at [facility])     Status: Abnormal   Collection Time: 10/12/15 11:32  AM  Result Value Ref Range   Color, Urine YELLOW YELLOW   APPearance CLEAR CLEAR   Specific Gravity, Urine 1.020 1.005 - 1.030   pH 6.0 5.0 - 8.0   Glucose, UA NEGATIVE NEGATIVE mg/dL   Hgb urine dipstick SMALL (A) NEGATIVE   Bilirubin Urine NEGATIVE NEGATIVE   Ketones, ur NEGATIVE NEGATIVE mg/dL   Protein, ur NEGATIVE NEGATIVE mg/dL   Nitrite NEGATIVE NEGATIVE   Leukocytes, UA NEGATIVE NEGATIVE  Urine microscopic-add on     Status: Abnormal   Collection Time: 10/12/15 11:32 AM  Result Value Ref Range   Squamous Epithelial / LPF 0-5 (A) NONE SEEN   WBC, UA 6-30 0 - 5 WBC/hpf   RBC / HPF 6-30 0 - 5 RBC/hpf   Bacteria, UA FEW (A) NONE SEEN   Urine-Other MUCOUS PRESENT    Dg Chest 2 View  Result Date: 10/12/2015 CLINICAL DATA:  Shortness of breath, upper abdominal pain EXAM: CHEST  2 VIEW COMPARISON:  None. FINDINGS: Lungs are clear.  No pleural effusion or pneumothorax. The heart is normal in size. Visualized osseous structures are within normal limits. IMPRESSION: No evidence of acute cardiopulmonary disease. Electronically Signed   By: Julian Hy M.D.    On: 10/12/2015 12:49   Ct Renal Stone Study  Result Date: 10/12/2015 CLINICAL DATA:  Initial evaluation for acute right-sided right flank pain since last night. History of gallstones. EXAM: CT ABDOMEN AND PELVIS WITHOUT CONTRAST TECHNIQUE: Multidetector CT imaging of the abdomen and pelvis was performed following the standard protocol without IV contrast. COMPARISON:  None. FINDINGS: Minimal subsegmental atelectasis seen dependently within the visualized lung bases. 4 mm sub solid nodule present within the right lower lobe (series 4, image 2), indeterminate. Additional 4 mm left lower lobe nodule noted (series 4, image 10). Visualized lungs are otherwise clear. Limited noncontrast evaluation of the liver is unremarkable. Gallbladder within normal limits. No biliary dilatation. Spleen, right adrenal gland, and pancreas demonstrate a normal unenhanced appearance. 6 mm hypodense nodule arising from the left adrenal gland noted (series 2, image 26), indeterminate, but of doubtful clinical significance, and possibly reflecting a small adenoma. Kidneys equal in size without hydronephrosis. Punctate nonobstructive calculus within the lower pole of the right kidney (series 5, image 54). Additional faint calcific densities overlying the renal papilla of both kidneys also suspicious for possible tiny nonobstructive calculi. No radiopaque calculi seen along the course of either ureter. There is no hydroureter. Stomach within normal limits. No evidence for bowel obstruction. Appendix well visualized in the right lower quadrant, and is retrocecal in location. Appendix is dilated up to 17 mm in diameter with wall thickening and periappendiceal fat stranding, concerning for acute appendicitis. No periappendiceal abscess or evidence for perforation. No other acute inflammatory changes about the bowels. Bladder within normal limits. No layering stones present within the bladder lumen. Prostate normal. No free air or fluid. No  pathologically enlarged intra-abdominal or pelvic lymph nodes. Mild aorto bi-iliac atherosclerotic disease. No aneurysm. No acute osseous abnormality. No worrisome lytic or blastic osseous lesions. Few scattered degenerative endplate Schmorl's nodes noted. IMPRESSION: 1. Findings consistent with acute appendicitis. No evidence for perforation or other complication. 2. Faint punctate calcifications scattered throughout both kidneys, suspicious for possible small nonobstructive renal calculi. No CT evidence for obstructive uropathy. 3. Two 4 mm nodules present within the visualized lung bases as above, indeterminate. No follow-up needed if patient is low-risk (and has no known or suspected primary neoplasm). Non-contrast chest CT can be  considered in 12 months if patient is high-risk. This recommendation follows the consensus statement: Guidelines for Management of Incidental Pulmonary Nodules Detected on CT Images:From the Fleischner Society 2017; published online before print (10.1148/radiol.[id number]). Electronically Signed   By: Jeannine Boga M.D.   On: 10/12/2015 13:50    Review of Systems  Constitutional: Negative for weight loss.  HENT: Negative for ear discharge, ear pain, hearing loss and tinnitus.        Dental pain  Eyes: Negative for blurred vision, double vision, photophobia and pain.  Respiratory: Negative for cough, sputum production and shortness of breath.   Cardiovascular: Negative for chest pain.  Gastrointestinal: Positive for abdominal pain. Negative for nausea and vomiting.  Genitourinary: Positive for flank pain. Negative for dysuria, frequency and urgency.  Musculoskeletal: Positive for back pain. Negative for falls, joint pain, myalgias and neck pain.  Neurological: Negative for dizziness, tingling, sensory change, focal weakness, loss of consciousness and headaches.  Endo/Heme/Allergies: Does not bruise/bleed easily.  Psychiatric/Behavioral: Negative for depression,  memory loss and substance abuse. The patient is not nervous/anxious.     Blood pressure 148/73, pulse 73, temperature 98.5 F (36.9 C), temperature source Oral, resp. rate 20, SpO2 97 %. Physical Exam  Constitutional: He appears well-developed and well-nourished. No distress.  HENT:  Head: Normocephalic and atraumatic.    Mouth/Throat: Dental caries present.  Eyes: Conjunctivae and EOM are normal. Right eye exhibits no discharge. Left eye exhibits no discharge. No scleral icterus.  Neck: Normal range of motion. Neck supple.  Cardiovascular: Normal rate, regular rhythm, normal heart sounds and intact distal pulses.  Exam reveals no gallop and no friction rub.   No murmur heard. Respiratory: Effort normal and breath sounds normal. No respiratory distress. He has no wheezes. He has no rales.  GI: Soft. There is tenderness in the right upper quadrant and right lower quadrant. There is CVA tenderness and tenderness at McBurney's point. There is no rebound.  Musculoskeletal: Normal range of motion.  Lymphadenopathy:    He has no cervical adenopathy.  Neurological: He is alert.  Skin: Skin is warm and dry. He is not diaphoretic.  Psychiatric: He has a normal mood and affect. His behavior is normal.     Assessment/Plan Acute appendicitis -- Discussed surgical and medical treatments for appendicitis and the risks/benefits of each. He would like to proceed with lap appy. He will remain NPO. Agree with perioperative abx. Advanced dental disease Asthma -- Untreated and smokes, councilled    Lisette Abu, PA-C Pager: [phone] 10/12/2015, 3:08 PM

## 2015-10-12 NOTE — ED Notes (Signed)
Pt transported to CT ?

## 2015-10-12 NOTE — Progress Notes (Signed)
Dilaudid 1mg . IV @ 1942

## 2015-10-12 NOTE — ED Notes (Signed)
Took ekg of patient, notified dr

## 2015-10-12 NOTE — Anesthesia Preprocedure Evaluation (Addendum)
Anesthesia Evaluation  Patient identified by MRN, date of birth, ID band Patient awake    Reviewed: Allergy & Precautions, NPO status , Patient's Chart, lab work & pertinent test results  History of Anesthesia Complications Negative for: history of anesthetic complications  Airway Mallampati: II  TM Distance: >3 FB Neck ROM: full    Dental  (+) Poor Dentition, Dental Advisory Given   Pulmonary asthma , Current Smoker,    breath sounds clear to auscultation       Cardiovascular negative cardio ROS   Rhythm:regular Rate:Normal     Neuro/Psych  Headaches, PSYCHIATRIC DISORDERS Anxiety    GI/Hepatic Neg liver ROS, GERD  ,  Endo/Other  negative endocrine ROS  Renal/GU Renal diseaseHas a kidney stone now.     Musculoskeletal  (+) Arthritis ,   Abdominal   Peds  Hematology negative hematology ROS (+)   Anesthesia Other Findings Per patient: "My teeth are rotten."  Reproductive/Obstetrics                           Anesthesia Physical Anesthesia Plan  ASA: II and emergent  Anesthesia Plan: General   Post-op Pain Management:    Induction: Intravenous  Airway Management Planned: Oral ETT  Additional Equipment:   Intra-op Plan:   Post-operative Plan: Extubation in OR  Informed Consent: I have reviewed the patients History and Physical, chart, labs and discussed the procedure including the risks, benefits and alternatives for the proposed anesthesia with the patient or authorized representative who has indicated his/her understanding and acceptance.   Dental advisory given  Plan Discussed with: CRNA, Anesthesiologist and Surgeon  Anesthesia Plan Comments:        Anesthesia Quick Evaluation

## 2015-10-13 ENCOUNTER — Encounter (HOSPITAL_COMMUNITY): Payer: Self-pay | Admitting: General Surgery

## 2015-10-13 ENCOUNTER — Encounter: Payer: Self-pay | Admitting: General Surgery

## 2015-10-13 MED ORDER — ACETAMINOPHEN 650 MG RE SUPP
650.0000 mg | Freq: Four times a day (QID) | RECTAL | Status: DC | PRN
Start: 2015-10-13 — End: 2015-10-13

## 2015-10-13 MED ORDER — KETOROLAC TROMETHAMINE 15 MG/ML IJ SOLN
15.0000 mg | Freq: Three times a day (TID) | INTRAMUSCULAR | Status: DC
  Administered 2015-10-13 (×2): 15 mg via INTRAVENOUS
  Filled 2015-10-13 (×2): qty 1

## 2015-10-13 MED ORDER — ENOXAPARIN SODIUM 40 MG/0.4ML ~~LOC~~ SOLN
40.0000 mg | Freq: Every day | SUBCUTANEOUS | Status: DC
  Administered 2015-10-13: 40 mg via SUBCUTANEOUS
  Filled 2015-10-13: qty 0.4

## 2015-10-13 MED ORDER — OXYCODONE HCL 5 MG PO TABS
5.0000 mg | ORAL_TABLET | ORAL | Status: DC | PRN
  Administered 2015-10-13: 10 mg via ORAL
  Administered 2015-10-13: 5 mg via ORAL
  Filled 2015-10-13: qty 1
  Filled 2015-10-13: qty 2

## 2015-10-13 MED ORDER — ACETAMINOPHEN 325 MG PO TABS
650.0000 mg | ORAL_TABLET | Freq: Four times a day (QID) | ORAL | Status: DC | PRN
  Administered 2015-10-13: 650 mg via ORAL
  Filled 2015-10-13: qty 2

## 2015-10-13 MED ORDER — DIPHENHYDRAMINE HCL 50 MG/ML IJ SOLN
12.5000 mg | Freq: Four times a day (QID) | INTRAMUSCULAR | Status: DC | PRN
Start: 2015-10-13 — End: 2015-10-13

## 2015-10-13 MED ORDER — IBUPROFEN 200 MG PO TABS: ORAL_TABLET | ORAL | 0 refills | Status: DC

## 2015-10-13 MED ORDER — MORPHINE SULFATE (PF) 2 MG/ML IV SOLN: 1.0000 mg | INTRAVENOUS | Status: DC | PRN

## 2015-10-13 MED ORDER — METRONIDAZOLE IN NACL 5-0.79 MG/ML-% IV SOLN: 500.0000 mg | Freq: Once | INTRAVENOUS | Status: DC

## 2015-10-13 MED ORDER — DIPHENHYDRAMINE HCL 12.5 MG/5ML PO ELIX: 12.5000 mg | ORAL_SOLUTION | Freq: Four times a day (QID) | ORAL | Status: DC | PRN

## 2015-10-13 MED ORDER — ACETAMINOPHEN 325 MG PO TABS: 650.0000 mg | ORAL_TABLET | Freq: Four times a day (QID) | ORAL | Status: DC | PRN

## 2015-10-13 MED ORDER — HYDROMORPHONE HCL 1 MG/ML IJ SOLN
0.5000 mg | INTRAMUSCULAR | Status: DC | PRN
  Administered 2015-10-13: 0.5 mg via INTRAVENOUS
  Administered 2015-10-13: 1 mg via INTRAVENOUS
  Filled 2015-10-13 (×2): qty 1

## 2015-10-13 MED ORDER — NICOTINE 21 MG/24HR TD PT24: MEDICATED_PATCH | TRANSDERMAL | 0 refills | Status: DC

## 2015-10-13 MED ORDER — DEXTROSE 5 % IV SOLN: 2.0000 g | Freq: Once | INTRAVENOUS | Status: DC

## 2015-10-13 MED ORDER — OXYCODONE HCL 5 MG PO TABS: 5.0000 mg | ORAL_TABLET | ORAL | 0 refills | Status: DC | PRN

## 2015-10-13 MED ORDER — PANTOPRAZOLE SODIUM 40 MG PO TBEC
40.0000 mg | DELAYED_RELEASE_TABLET | Freq: Every day | ORAL | Status: DC
Start: 2015-10-13 — End: 2015-10-13
  Administered 2015-10-13: 40 mg via ORAL
  Filled 2015-10-13: qty 1

## 2015-10-13 NOTE — Progress Notes (Signed)
Discharge home. Home discharge instruction given, no question verbalized. 

## 2015-10-13 NOTE — Clinical Social Work Note (Signed)
CSW met with patient and provided patient with free dental clinic resources.  CSW signing off.   [employer], LCSW [phone]

## 2015-10-13 NOTE — Progress Notes (Signed)
1 Day Post-Op  Subjective: A lot of pain at umbilicus requiring dilaudid, passing some gas, no n/v, voiding  Objective: Vital signs in last 24 hours: Temp:  [97.9 F (36.6 C)-98.7 F (37.1 C)] 98.4 F (36.9 C) (08/04 0500) Pulse Rate:  [64-105] 67 (08/04 0500) Resp:  [12-20] 19 (08/04 0500) BP: (121-157)/(72-112) 131/87 (08/04 0500) SpO2:  [90 %-100 %] 96 % (08/04 0500) Last BM Date: 10/12/15  Intake/Output from previous day: 08/03 0701 - 08/04 0700 In: 2000 [I.V.:2000] Out: 210 [Urine:175; Blood:35] Intake/Output this shift: Total I/O In: 2000 [I.V.:2000] Out: 210 [Urine:175; Blood:35]  Resp: clear to auscultation bilaterally Cardio: regular rate and rhythm GI: soft approp tender incisions all clean bs present  Lab Results:   Recent Labs  10/12/15 1120  WBC 8.7  HGB 16.7  HCT 48.3  PLT 219   BMET  Recent Labs  10/12/15 1120  NA 137  K 4.1  CL 105  CO2 18*  GLUCOSE 128*  BUN 7  CREATININE 1.13  CALCIUM 9.7   PT/INR No results for input(s): LABPROT, INR in the last 72 hours. ABG No results for input(s): PHART, HCO3 in the last 72 hours.  Invalid input(s): PCO2, PO2  Studies/Results: Dg Chest 2 View  Result Date: 10/12/2015 CLINICAL DATA:  Shortness of breath, upper abdominal pain EXAM: CHEST  2 VIEW COMPARISON:  None. FINDINGS: Lungs are clear.  No pleural effusion or pneumothorax. The heart is normal in size. Visualized osseous structures are within normal limits. IMPRESSION: No evidence of acute cardiopulmonary disease. Electronically Signed   By: Charline Bills M.D.   On: 10/12/2015 12:49   Ct Renal Stone Study  Result Date: 10/12/2015 CLINICAL DATA:  Initial evaluation for acute right-sided right flank pain since last night. History of gallstones. EXAM: CT ABDOMEN AND PELVIS WITHOUT CONTRAST TECHNIQUE: Multidetector CT imaging of the abdomen and pelvis was performed following the standard protocol without IV contrast. COMPARISON:  None. FINDINGS:  Minimal subsegmental atelectasis seen dependently within the visualized lung bases. 4 mm sub solid nodule present within the right lower lobe (series 4, image 2), indeterminate. Additional 4 mm left lower lobe nodule noted (series 4, image 10). Visualized lungs are otherwise clear. Limited noncontrast evaluation of the liver is unremarkable. Gallbladder within normal limits. No biliary dilatation. Spleen, right adrenal gland, and pancreas demonstrate a normal unenhanced appearance. 6 mm hypodense nodule arising from the left adrenal gland noted (series 2, image 26), indeterminate, but of doubtful clinical significance, and possibly reflecting a small adenoma. Kidneys equal in size without hydronephrosis. Punctate nonobstructive calculus within the lower pole of the right kidney (series 5, image 54). Additional faint calcific densities overlying the renal papilla of both kidneys also suspicious for possible tiny nonobstructive calculi. No radiopaque calculi seen along the course of either ureter. There is no hydroureter. Stomach within normal limits. No evidence for bowel obstruction. Appendix well visualized in the right lower quadrant, and is retrocecal in location. Appendix is dilated up to 17 mm in diameter with wall thickening and periappendiceal fat stranding, concerning for acute appendicitis. No periappendiceal abscess or evidence for perforation. No other acute inflammatory changes about the bowels. Bladder within normal limits. No layering stones present within the bladder lumen. Prostate normal. No free air or fluid. No pathologically enlarged intra-abdominal or pelvic lymph nodes. Mild aorto bi-iliac atherosclerotic disease. No aneurysm. No acute osseous abnormality. No worrisome lytic or blastic osseous lesions. Few scattered degenerative endplate Schmorl's nodes noted. IMPRESSION: 1. Findings consistent with acute  appendicitis. No evidence for perforation or other complication. 2. Faint punctate  calcifications scattered throughout both kidneys, suspicious for possible small nonobstructive renal calculi. No CT evidence for obstructive uropathy. 3. Two 4 mm nodules present within the visualized lung bases as above, indeterminate. No follow-up needed if patient is low-risk (and has no known or suspected primary neoplasm). Non-contrast chest CT can be considered in 12 months if patient is high-risk. This recommendation follows the consensus statement: Guidelines for Management of Incidental Pulmonary Nodules Detected on CT Images:From the Fleischner Society 2017; published online before print (10.1148/radiol.[id number]). Electronically Signed   By: Rise Mu M.D.   On: 10/12/2015 13:50    Anti-infectives: Anti-infectives    Start     Dose/Rate Route Frequency Ordered Stop   10/13/15 0030  cefTRIAXone (ROCEPHIN) 2 g in dextrose 5 % 50 mL IVPB  Status:  Discontinued     2 g 100 mL/hr over 30 Minutes Intravenous  Once 10/13/15 0028 10/13/15 0031   10/13/15 0030  metroNIDAZOLE (FLAGYL) IVPB 500 mg  Status:  Discontinued     500 mg 100 mL/hr over 60 Minutes Intravenous  Once 10/13/15 0028 10/13/15 0031   10/12/15 1415  cefTRIAXone (ROCEPHIN) 2 g in dextrose 5 % 50 mL IVPB     2 g 100 mL/hr over 30 Minutes Intravenous  Once 10/12/15 1412 10/12/15 1933   10/12/15 1415  metroNIDAZOLE (FLAGYL) IVPB 500 mg     500 mg 100 mL/hr over 60 Minutes Intravenous  Once 10/12/15 1412 10/12/15 1933      Assessment/Plan: POD 1 lap appy/uh repair  Will add toradol this am, pain not well controlled right now If does well might be able to go home later today  [facility] 10/13/2015

## 2015-10-13 NOTE — Discharge Instructions (Signed)
Laparoscopic Appendectomy, Adult, Care After Refer to this sheet in the next few weeks. These instructions provide you with information on caring for yourself after your procedure. Your caregiver may also give you more specific instructions. Your treatment has been planned according to current medical practices, but problems sometimes occur. Call your caregiver if you have any problems or questions after your procedure. HOME CARE INSTRUCTIONS  Do not drive while taking narcotic pain medicines.  Use stool softener if you become constipated from your pain medicines.  Change your bandages (dressings) as directed.  Keep your wounds clean and dry. You may wash the wounds gently with soap and water. Gently pat the wounds dry with a clean towel.  Do not take baths, swim, or use hot tubs for 10 days, or as instructed by your caregiver.  Only take over-the-counter or prescription medicines for pain, discomfort, or fever as directed by your caregiver.  You may continue your normal diet as directed.  Do not lift more than 10 pounds (4.5 kg) or play contact sports for 3 weeks, or as directed.  Slowly increase your activity after surgery.  Take deep breaths to avoid getting a lung infection (pneumonia). SEEK MEDICAL CARE IF:  You have redness, swelling, or increasing pain in your wounds.  You have pus coming from your wounds.  You have drainage from a wound that lasts longer than 1 day.  You notice a bad smell coming from the wounds or dressing.  Your wound edges break open after stitches (sutures) have been removed.  You notice increasing pain in the shoulders (shoulder strap areas) or near your shoulder blades.  You develop dizzy episodes or fainting while standing.  You develop shortness of breath.  You develop persistent nausea or vomiting.  You cannot control your bowel functions or lose your appetite.  You develop diarrhea. SEEK IMMEDIATE MEDICAL CARE IF:   You have a  fever.  You develop a rash.  You have difficulty breathing or sharp pains in your chest.  You develop any reaction or side effects to medicines given. MAKE SURE YOU:  Understand these instructions.  Will watch your condition.  Will get help right away if you are not doing well or get worse.   This information is not intended to replace advice given to you by your health care provider. Make sure you discuss any questions you have with your health care provider.   Document Released: 02/25/2005 Document Revised: 07/12/2014 Document Reviewed: 08/15/2014 Elsevier Interactive Patient Education 2016 [employer]. CCS _______[facility]  Post op appendectomy & UMBILICAL HERNIA REPAIR: POST OP INSTRUCTIONS  Always review your discharge instruction sheet given to you by the facility where your surgery was performed. IF YOU HAVE DISABILITY OR FAMILY LEAVE FORMS, YOU MUST BRING THEM TO THE OFFICE FOR PROCESSING.   DO NOT GIVE THEM TO YOUR DOCTOR.  1. A  prescription for pain medication may be given to you upon discharge.  Take your pain medication as prescribed, if needed.  If narcotic pain medicine is not needed, then you may take acetaminophen (Tylenol) or ibuprofen (Advil) as needed. 2. Take your usually prescribed medications unless otherwise directed. 3. If you need a refill on your pain medication, please contact your pharmacy.  They will contact our office to request authorization. Prescriptions will not be filled after 5 pm or on week-ends. 4. You should follow a light diet the first 24 hours after arrival home, such as soup and crackers, etc.  Be sure  to include lots of fluids daily.  Resume your normal diet the day after surgery. 5. Most patients will experience some swelling and bruising around the umbilicus or in the groin and scrotum.  Ice packs and reclining will help.  Swelling and bruising can take several days to resolve.  6. It is common to experience some  constipation if taking pain medication after surgery.  Increasing fluid intake and taking a stool softener (such as Colace) will usually help or prevent this problem from occurring.  A mild laxative (Milk of Magnesia or Miralax) should be taken according to package directions if there are no bowel movements after 48 hours. 7. Unless discharge instructions indicate otherwise, you may remove your bandages 24-48 hours after surgery, and you may shower at that time.  You may have steri-strips (small skin tapes) in place directly over the incision.  These strips should be left on the skin for 7-10 days.  If your surgeon used skin glue on the incision, you may shower in 24 hours.  The glue will flake off over the next 2-3 weeks.  Any sutures or staples will be removed at the office during your follow-up visit. 8. ACTIVITIES:  You may resume regular (light) daily activities beginning the next day--such as daily self-care, walking, climbing stairs--gradually increasing activities as tolerated.  You may have sexual intercourse when it is comfortable.  Refrain from any heavy lifting or straining until approved by your doctor. a. You may drive when you are no longer taking prescription pain medication, you can comfortably wear a seatbelt, and you can safely maneuver your car and apply brakes. b. RETURN TO WORK:  __________________________________________________________ 9. You should see your doctor in the office for a follow-up appointment approximately 2-3 weeks after your surgery.  Make sure that you call for this appointment within a day or two after you arrive home to insure a convenient appointment time. 10. OTHER INSTRUCTIONS:  __________________________________________________________________________________________________________________________________________________________________________________________  WHEN TO CALL YOUR DOCTOR: 1. Fever over 101.0 2. Inability to urinate 3. Nausea and/or  vomiting 4. Extreme swelling or bruising 5. Continued bleeding from incision. 6. Increased pain, redness, or drainage from the incision  The clinic staff is available to answer your questions during regular business hours.  Please dont hesitate to call and ask to speak to one of the nurses for clinical concerns.  If you have a medical emergency, go to the nearest emergency room or call 911.  A surgeon from [facility] is always on call at the hospital   [address], [address], [place], [place]  [address] ?  [address] [address], [place], [place]   [address] [phone] ? [phone] ? FAX [phone] Web site: [email or web address]

## 2015-10-13 NOTE — Discharge Summary (Signed)
Physician Discharge Summary  Patient ID: Edward Kelley MRN: [id number] DOB/AGE: 07/14/1970 45 y.o.  Admit date: 10/12/2015 Discharge date: 10/13/2015  Admission Diagnoses:  Acute appendicitis Umbilical hernia Chronic dental disease/multiple carries Tobacco use/Pulmonary nodule  Asthma   Discharge Diagnoses:  Acute appendicitis Umbilical hernia Chronic dental disease/multiple carries Tobacco use Asthma   Active Problems:   Acute appendicitis   PROCEDURES: laparoscopic appendectomy 10/12/15,Dr. Leonides Grills Course:  Edward Kelley was in his usual state of health which included battling chronic dental disease. He went to sleep last night and was awoken at about 0200 with severe right back pain. The pain continued to progress and he came to the ED about 1000. He denies any N/V/fevers. He takes ibuprofen often for his dental disease. A CT scan was positive for an acute retrocecal appendicitis.  He was seen in the ED and admitted.  He was taken to the OR later in the PM and underwent appendectomy and closed the fascia with 3 interrupted #1 Novafil sutures.  Post op he had a fair amount of pain and this has improved with Toradol.  He ate some for lunch, but mostly soft bread because of dental pain.  There is no adult Dental call on today.  I did ask Social services to see and help with outpatient treatment.  He has had this issue for some time.    CBC Latest Ref Rng & Units 10/12/2015  WBC 4.0 - 10.5 K/uL 8.7  Hemoglobin 13.0 - 17.0 g/dL 96.2  Hematocrit 95.2 - 52.0 % 48.3  Platelets 150 - 400 K/uL 219   CMP Latest Ref Rng & Units 10/12/2015  Glucose 65 - 99 mg/dL 841(L)  BUN 6 - 20 mg/dL 7  Creatinine 2.44 - 0.10 mg/dL 2.72  Sodium 536 - 644 mmol/L 137  Potassium 3.5 - 5.1 mmol/L 4.1  Chloride 101 - 111 mmol/L 105  CO2 22 - 32 mmol/L 18(L)  Calcium 8.9 - 10.3 mg/dL 9.7  Total Protein 6.5 - 8.1 g/dL 7.3  Total Bilirubin 0.3 - 1.2 mg/dL 0.6  Alkaline Phos 38 - 126 U/L 57  AST 15 - 41  U/L 26  ALT 17 - 63 U/L 35   Condition on d/c:  Improved   Disposition: 01-Home or Self Care     Medication List    STOP taking these medications   amoxicillin 500 MG capsule Commonly known as:  AMOXIL   antipyrine-benzocaine otic solution Commonly known as:  AURALGAN   diazepam 5 MG tablet Commonly known as:  VALIUM   HYDROcodone-acetaminophen 5-325 MG tablet Commonly known as:  NORCO/VICODIN     TAKE these medications   acetaminophen 325 MG tablet Commonly known as:  TYLENOL Take 2 tablets (650 mg total) by mouth every 6 (six) hours as needed for mild pain or moderate pain (Do not take more than 4000 mg of Tylenol(acetaminophen) per day.).   ibuprofen 200 MG tablet Commonly known as:  ADVIL,MOTRIN You can safely take 2-3 every 6 hours for pain.  If you are using this chronically it can cause ulcers.  It can also cause renal failure. What changed:  how much to take  how to take this  when to take this  reasons to take this  additional instructions   nicotine 21 mg/24hr patch Commonly known as:  NICODERM CQ - dosed in mg/24 hours You can buy this over the counter, and follow instructions to help with smoking cessation.   oxyCODONE 5 MG immediate release tablet Commonly known  as:  Oxy IR/ROXICODONE Take 1-2 tablets (5-10 mg total) by mouth every 4 (four) hours as needed for moderate pain.      Follow-up Information    [facility] Follow up on 11/01/2015.   Specialty:  General Surgery Why:  Your appointment is at 9AM, be a the office 30 minutes early for check in. Contact information: [address] [place] [address] [phone]        [facility] .   Why:  Call and see if you can get an appointment for medical care. Contact information: [address] [place] [address] [phone]          Signed: Sherrie George 10/13/2015, 3:29 PM

## 2015-10-23 NOTE — Anesthesia Postprocedure Evaluation (Signed)
Anesthesia Post Note  Patient: Edward Kelley  Procedure(s) Performed: Procedure(s) (LRB): APPENDECTOMY LAPAROSCOPIC (N/A)  Patient location during evaluation: PACU Anesthesia Type: General Level of consciousness: awake and alert and patient cooperative Pain management: pain level controlled Vital Signs Assessment: post-procedure vital signs reviewed and stable Respiratory status: spontaneous breathing and respiratory function stable Cardiovascular status: stable Anesthetic complications: no    Last Vitals:  Vitals:   10/13/15 1433 10/13/15 1515  BP: (!) 148/85 (!) 150/77  Pulse: 78 (!) 54  Resp:    Temp: 36.4 C 37.1 C    Last Pain:  Vitals:   10/13/15 1433  TempSrc: Oral  PainSc:                  Florabel Faulks S

## 2019-06-02 ENCOUNTER — Encounter (HOSPITAL_COMMUNITY): Payer: Self-pay | Admitting: *Deleted

## 2019-06-02 ENCOUNTER — Other Ambulatory Visit: Payer: Self-pay

## 2019-06-02 ENCOUNTER — Observation Stay (HOSPITAL_COMMUNITY)
Admission: EM | Admit: 2019-06-02 | Discharge: 2019-06-03 | Discharge status: Against Medical Advice | Payer: Self-pay | Attending: Internal Medicine | Admitting: Internal Medicine

## 2019-06-02 ENCOUNTER — Emergency Department (HOSPITAL_COMMUNITY): Payer: Self-pay

## 2019-06-02 DIAGNOSIS — Z20822 Contact with and (suspected) exposure to covid-19: Secondary | ICD-10-CM | POA: Insufficient documentation

## 2019-06-02 DIAGNOSIS — Z885 Allergy status to narcotic agent status: Secondary | ICD-10-CM | POA: Insufficient documentation

## 2019-06-02 DIAGNOSIS — E871 Hypo-osmolality and hyponatremia: Secondary | ICD-10-CM | POA: Insufficient documentation

## 2019-06-02 DIAGNOSIS — I160 Hypertensive urgency: Secondary | ICD-10-CM | POA: Insufficient documentation

## 2019-06-02 DIAGNOSIS — J4541 Moderate persistent asthma with (acute) exacerbation: Principal | ICD-10-CM

## 2019-06-02 DIAGNOSIS — J45901 Unspecified asthma with (acute) exacerbation: Secondary | ICD-10-CM | POA: Diagnosis present

## 2019-06-02 DIAGNOSIS — M1990 Unspecified osteoarthritis, unspecified site: Secondary | ICD-10-CM | POA: Insufficient documentation

## 2019-06-02 DIAGNOSIS — F419 Anxiety disorder, unspecified: Secondary | ICD-10-CM | POA: Insufficient documentation

## 2019-06-02 DIAGNOSIS — F17200 Nicotine dependence, unspecified, uncomplicated: Secondary | ICD-10-CM

## 2019-06-02 DIAGNOSIS — F17210 Nicotine dependence, cigarettes, uncomplicated: Secondary | ICD-10-CM | POA: Insufficient documentation

## 2019-06-02 DIAGNOSIS — K219 Gastro-esophageal reflux disease without esophagitis: Secondary | ICD-10-CM | POA: Insufficient documentation

## 2019-06-02 DIAGNOSIS — F10129 Alcohol abuse with intoxication, unspecified: Secondary | ICD-10-CM

## 2019-06-02 DIAGNOSIS — E876 Hypokalemia: Secondary | ICD-10-CM

## 2019-06-02 LAB — CBC
HCT: 48.5 % (ref 39.0–52.0)
Hemoglobin: 16.9 g/dL (ref 13.0–17.0)
MCH: 28.9 pg (ref 26.0–34.0)
MCHC: 34.8 g/dL (ref 30.0–36.0)
MCV: 83 fL (ref 80.0–100.0)
Platelets: 208 10*3/uL (ref 150–400)
RBC: 5.84 MIL/uL — ABNORMAL HIGH (ref 4.22–5.81)
RDW: 13.5 % (ref 11.5–15.5)
WBC: 3.3 10*3/uL — ABNORMAL LOW (ref 4.0–10.5)
nRBC: 0 % (ref 0.0–0.2)

## 2019-06-02 LAB — CREATININE, SERUM
Creatinine, Ser: 1.31 mg/dL — ABNORMAL HIGH (ref 0.61–1.24)
GFR calc Af Amer: >60 mL/min (ref >60)
GFR calc non Af Amer: >60 mL/min (ref >60)

## 2019-06-02 LAB — BASIC METABOLIC PANEL
Anion gap: 15 (ref 5–15)
BUN: <5 mg/dL — ABNORMAL LOW (ref 6–20)
CO2: 23 mmol/L (ref 22–32)
Calcium: 8.9 mg/dL (ref 8.9–10.3)
Chloride: 96 mmol/L — ABNORMAL LOW (ref 98–111)
Creatinine, Ser: 1.1 mg/dL (ref 0.61–1.24)
GFR calc Af Amer: >60 mL/min (ref >60)
GFR calc non Af Amer: >60 mL/min (ref >60)
Glucose, Bld: 154 mg/dL — ABNORMAL HIGH (ref 70–99)
Potassium: 2.6 mmol/L — CL (ref 3.5–5.1)
Sodium: 134 mmol/L — ABNORMAL LOW (ref 135–145)

## 2019-06-02 LAB — CBC WITH DIFFERENTIAL/PLATELET
Abs Immature Granulocytes: 0.02 10*3/uL (ref 0.00–0.07)
Basophils Absolute: 0 10*3/uL (ref 0.0–0.1)
Basophils Relative: 1 %
Eosinophils Absolute: 0.1 10*3/uL (ref 0.0–0.5)
Eosinophils Relative: 1 %
HCT: 49.8 % (ref 39.0–52.0)
Hemoglobin: 17.6 g/dL — ABNORMAL HIGH (ref 13.0–17.0)
Immature Granulocytes: 0 %
Lymphocytes Relative: 32 %
Lymphs Abs: 1.6 10*3/uL (ref 0.7–4.0)
MCH: 29 pg (ref 26.0–34.0)
MCHC: 35.3 g/dL (ref 30.0–36.0)
MCV: 82.2 fL (ref 80.0–100.0)
Monocytes Absolute: 0.5 10*3/uL (ref 0.1–1.0)
Monocytes Relative: 11 %
Neutro Abs: 2.7 10*3/uL (ref 1.7–7.7)
Neutrophils Relative %: 55 %
Platelets: 195 10*3/uL (ref 150–400)
RBC: 6.06 MIL/uL — ABNORMAL HIGH (ref 4.22–5.81)
RDW: 13.3 % (ref 11.5–15.5)
WBC: 4.9 10*3/uL (ref 4.0–10.5)
nRBC: 0 % (ref 0.0–0.2)

## 2019-06-02 LAB — NA AND K (SODIUM & POTASSIUM), RAND UR
Potassium Urine: 7 mmol/L
Sodium, Ur: <10 mmol/L

## 2019-06-02 LAB — POC SARS CORONAVIRUS 2 AG -  ED: SARS Coronavirus 2 Ag: NEGATIVE

## 2019-06-02 LAB — HIV ANTIBODY (ROUTINE TESTING W REFLEX): HIV Screen 4th Generation wRfx: NONREACTIVE

## 2019-06-02 LAB — RESPIRATORY PANEL BY RT PCR (FLU A&B, COVID)
Influenza A by PCR: NEGATIVE
Influenza B by PCR: NEGATIVE
SARS Coronavirus 2 by RT PCR: NEGATIVE

## 2019-06-02 LAB — BRAIN NATRIURETIC PEPTIDE: B Natriuretic Peptide: 15.2 pg/mL (ref 0.0–100.0)

## 2019-06-02 LAB — MAGNESIUM: Magnesium: 1.9 mg/dL (ref 1.7–2.4)

## 2019-06-02 MED ORDER — HYDROCOD POLST-CPM POLST ER 10-8 MG/5ML PO SUER
5.0000 mL | Freq: Once | ORAL | Status: AC
Start: 2019-06-02 — End: 2019-06-02
  Administered 2019-06-02: 5 mL via ORAL
  Filled 2019-06-02: qty 5

## 2019-06-02 MED ORDER — THIAMINE HCL 100 MG PO TABS: 100.0000 mg | ORAL_TABLET | Freq: Every day | ORAL | Status: DC

## 2019-06-02 MED ORDER — POTASSIUM CHLORIDE CRYS ER 20 MEQ PO TBCR
40.0000 meq | EXTENDED_RELEASE_TABLET | Freq: Once | ORAL | Status: AC
  Administered 2019-06-02: 16:00:00 40 meq via ORAL
  Filled 2019-06-02: qty 2

## 2019-06-02 MED ORDER — AMLODIPINE BESYLATE 10 MG PO TABS
10.0000 mg | ORAL_TABLET | Freq: Every day | ORAL | Status: DC
  Administered 2019-06-02: 10 mg via ORAL
  Filled 2019-06-02: qty 2

## 2019-06-02 MED ORDER — IPRATROPIUM-ALBUTEROL 0.5-2.5 (3) MG/3ML IN SOLN
3.0000 mL | Freq: Four times a day (QID) | RESPIRATORY_TRACT | Status: DC
  Administered 2019-06-02 – 2019-06-03 (×2): 3 mL via RESPIRATORY_TRACT
  Filled 2019-06-02 (×2): qty 3

## 2019-06-02 MED ORDER — SENNOSIDES-DOCUSATE SODIUM 8.6-50 MG PO TABS: 1.0000 | ORAL_TABLET | Freq: Every evening | ORAL | Status: DC | PRN

## 2019-06-02 MED ORDER — DM-GUAIFENESIN ER 30-600 MG PO TB12
1.0000 | ORAL_TABLET | Freq: Two times a day (BID) | ORAL | Status: DC
  Administered 2019-06-02: 1 via ORAL
  Filled 2019-06-02 (×2): qty 1

## 2019-06-02 MED ORDER — ONDANSETRON HCL 4 MG PO TABS: 4.0000 mg | ORAL_TABLET | Freq: Four times a day (QID) | ORAL | Status: DC | PRN

## 2019-06-02 MED ORDER — NICOTINE 21 MG/24HR TD PT24: 21.0000 mg | MEDICATED_PATCH | Freq: Every day | TRANSDERMAL | Status: DC

## 2019-06-02 MED ORDER — ACETAMINOPHEN 325 MG PO TABS: 650.0000 mg | ORAL_TABLET | Freq: Four times a day (QID) | ORAL | Status: DC | PRN

## 2019-06-02 MED ORDER — ALBUTEROL SULFATE (2.5 MG/3ML) 0.083% IN NEBU
2.5000 mg | INHALATION_SOLUTION | RESPIRATORY_TRACT | Status: DC | PRN
  Administered 2019-06-02: 2.5 mg via RESPIRATORY_TRACT
  Filled 2019-06-02: qty 3

## 2019-06-02 MED ORDER — FOLIC ACID 1 MG PO TABS
1.0000 mg | ORAL_TABLET | Freq: Every day | ORAL | Status: DC
  Administered 2019-06-02: 1 mg via ORAL
  Filled 2019-06-02: qty 1

## 2019-06-02 MED ORDER — ADULT MULTIVITAMIN W/MINERALS CH
1.0000 | ORAL_TABLET | Freq: Every day | ORAL | Status: DC
  Administered 2019-06-02: 1 via ORAL
  Filled 2019-06-02: qty 1

## 2019-06-02 MED ORDER — ZOLPIDEM TARTRATE 5 MG PO TABS: 5.0000 mg | ORAL_TABLET | Freq: Every evening | ORAL | Status: DC | PRN

## 2019-06-02 MED ORDER — ONDANSETRON HCL 4 MG/2ML IJ SOLN: 4.0000 mg | Freq: Four times a day (QID) | INTRAMUSCULAR | Status: DC | PRN

## 2019-06-02 MED ORDER — LORAZEPAM 0.5 MG PO TABS: 0.5000 mg | ORAL_TABLET | Freq: Four times a day (QID) | ORAL | Status: DC | PRN

## 2019-06-02 MED ORDER — OXYCODONE HCL 5 MG PO TABS: 5.0000 mg | ORAL_TABLET | ORAL | Status: DC | PRN

## 2019-06-02 MED ORDER — ALBUTEROL (5 MG/ML) CONTINUOUS INHALATION SOLN
10.0000 mg/h | INHALATION_SOLUTION | Freq: Once | RESPIRATORY_TRACT | Status: DC
  Filled 2019-06-02: qty 20

## 2019-06-02 MED ORDER — ENOXAPARIN SODIUM 40 MG/0.4ML ~~LOC~~ SOLN
40.0000 mg | SUBCUTANEOUS | Status: DC
  Administered 2019-06-02: 40 mg via SUBCUTANEOUS
  Filled 2019-06-02: qty 0.4

## 2019-06-02 MED ORDER — LORAZEPAM 2 MG/ML IJ SOLN
1.0000 mg | INTRAMUSCULAR | Status: DC | PRN
  Administered 2019-06-02 – 2019-06-03 (×2): 2 mg via INTRAVENOUS
  Filled 2019-06-02 (×2): qty 1

## 2019-06-02 MED ORDER — LORAZEPAM 2 MG/ML IJ SOLN
1.0000 mg | Freq: Once | INTRAMUSCULAR | Status: AC
  Administered 2019-06-02: 1 mg via INTRAVENOUS
  Filled 2019-06-02: qty 1

## 2019-06-02 MED ORDER — ACETAMINOPHEN 500 MG PO TABS
1000.0000 mg | ORAL_TABLET | Freq: Once | ORAL | Status: AC
  Administered 2019-06-02: 14:00:00 1000 mg via ORAL
  Filled 2019-06-02: qty 2

## 2019-06-02 MED ORDER — ACETAMINOPHEN 650 MG RE SUPP: 650.0000 mg | Freq: Four times a day (QID) | RECTAL | Status: DC | PRN

## 2019-06-02 MED ORDER — BISACODYL 10 MG RE SUPP: 10.0000 mg | Freq: Every day | RECTAL | Status: DC | PRN

## 2019-06-02 MED ORDER — LORAZEPAM 1 MG PO TABS
1.0000 mg | ORAL_TABLET | ORAL | Status: DC | PRN
  Administered 2019-06-02: 1 mg via ORAL
  Filled 2019-06-02: qty 1

## 2019-06-02 MED ORDER — METHYLPREDNISOLONE SODIUM SUCC 125 MG IJ SOLR
60.0000 mg | Freq: Two times a day (BID) | INTRAMUSCULAR | Status: DC
  Administered 2019-06-02 – 2019-06-03 (×2): 60 mg via INTRAVENOUS
  Filled 2019-06-02 (×2): qty 2

## 2019-06-02 MED ORDER — MAGNESIUM SULFATE 2 GM/50ML IV SOLN
2.0000 g | Freq: Once | INTRAVENOUS | Status: AC
  Administered 2019-06-02: 2 g via INTRAVENOUS
  Filled 2019-06-02: qty 50

## 2019-06-02 MED ORDER — FLEET ENEMA 7-19 GM/118ML RE ENEM: 1.0000 | ENEMA | Freq: Once | RECTAL | Status: DC | PRN

## 2019-06-02 MED ORDER — THIAMINE HCL 100 MG/ML IJ SOLN
100.0000 mg | Freq: Every day | INTRAMUSCULAR | Status: DC
  Administered 2019-06-02: 100 mg via INTRAVENOUS
  Filled 2019-06-02: qty 2

## 2019-06-02 MED ORDER — POTASSIUM CHLORIDE 20 MEQ/15ML (10%) PO SOLN
40.0000 meq | ORAL | Status: AC
  Administered 2019-06-02 (×2): 40 meq via ORAL
  Filled 2019-06-02 (×2): qty 30

## 2019-06-02 MED ORDER — DOCUSATE SODIUM 100 MG PO CAPS
100.0000 mg | ORAL_CAPSULE | Freq: Two times a day (BID) | ORAL | Status: DC
  Administered 2019-06-02: 23:00:00 100 mg via ORAL
  Filled 2019-06-02: qty 1

## 2019-06-02 MED ORDER — METOPROLOL TARTRATE 5 MG/5ML IV SOLN: 2.5000 mg | INTRAVENOUS | Status: DC | PRN

## 2019-06-02 MED ORDER — BUDESONIDE 0.5 MG/2ML IN SUSP
0.5000 mg | Freq: Two times a day (BID) | RESPIRATORY_TRACT | Status: DC
  Administered 2019-06-03: 0.5 mg via RESPIRATORY_TRACT
  Filled 2019-06-02 (×3): qty 2

## 2019-06-02 NOTE — Progress Notes (Signed)
RT is unable to administer CAT at this time due to pt being ruled out for COVID19. Pt currently on Air/Cont precautions, ED MD made aware. RT will continue to monitor for negative results.

## 2019-06-02 NOTE — ED Provider Notes (Signed)
[facility] EMERGENCY DEPARTMENT Provider Note   CSN: [id number] Arrival date & time: 06/02/19  1303     History Chief Complaint  Patient presents with  . Shortness of Breath  . Generalized Body Aches    Edward Kelley. is a 49 y.o. male.  Pt presents to the ED today with sob.  Pt said he's had sob and cough since 3/18.  Body aches started on 3/20.  He has a hx of asthma and had quit smoking for about a year, but has recently started again.  No known covid contacts.  Pt called EMS who gave him 125 mg of solumedrol and 2 nebs.  Pt has no albuterol inhalers at home.  He did call out EMS last night who gave him a neb last night.  Today, breathing was worse.          Past Medical History:  Diagnosis Date  . Anxiety   . Arthritis    "neck & shoulders" (10/12/2015)  . Asthma   . Bursitis    "neck & shoulders"  . Chronic back pain    "all over" (10/12/2015)  . Dental disease    advanced/notes 10/12/2015  . GERD (gastroesophageal reflux disease)   . Headache    "weekly" (10/12/2015)  . Kidney stone    "currently" (10/12/2015)  . Migraine    "weekly" (10/12/2015)    Patient Active Problem List   Diagnosis Date Noted  . Acute appendicitis 10/12/2015    Past Surgical History:  Procedure Laterality Date  . HERNIA REPAIR    . LAPAROSCOPIC APPENDECTOMY N/A 10/12/2015   Procedure: APPENDECTOMY LAPAROSCOPIC;  Surgeon: Edward Adu, MD;  Location: [facility] OR;  Service: General;  Laterality: N/A;  . WISDOM TOOTH EXTRACTION         No family history on file.  Social History   Tobacco Use  . Smoking status: Current Every Day Smoker    Packs/day: 1.00    Years: 30.00    Pack years: 30.00    Types: Cigarettes  . Smokeless tobacco: Never Used  Substance Use Topics  . Alcohol use: Yes    Comment: 10/12/2015 "1/2 gallon vodka twice/month"  . Drug use: Yes    Types: Marijuana    Home Medications Prior to Admission medications   Medication Sig Start Date End Date  Taking? Authorizing Provider  acetaminophen (TYLENOL) 325 MG tablet Take 2 tablets (650 mg total) by mouth every 6 (six) hours as needed for mild pain or moderate pain (Do not take more than 4000 mg of Tylenol(acetaminophen) per day.). 10/13/15   Sherrie George, PA-C  ibuprofen (ADVIL,MOTRIN) 200 MG tablet You can safely take 2-3 every 6 hours for pain.  If you are using this chronically it can cause ulcers.  It can also cause renal failure. 10/13/15   Sherrie George, PA-C  nicotine (NICODERM CQ - DOSED IN MG/24 HOURS) 21 mg/24hr patch You can buy this over the counter, and follow instructions to help with smoking cessation. 10/13/15   Sherrie George, PA-C  oxyCODONE (OXY IR/ROXICODONE) 5 MG immediate release tablet Take 1-2 tablets (5-10 mg total) by mouth every 4 (four) hours as needed for moderate pain. 10/13/15   Sherrie George, PA-C    Allergies    Codeine  Review of Systems   Review of Systems  Respiratory: Positive for cough, shortness of breath and wheezing.   All other systems reviewed and are negative.   Physical Exam Updated Vital Signs BP (!) 143/97  Pulse 90   Temp 100.2 F (37.9 C) (Oral)   Resp 13   Ht 5\' 7"  (1.702 m)   Wt 81.6 kg   SpO2 96%   BMI 28.19 kg/m   Physical Exam Vitals and nursing note reviewed.  Constitutional:      Appearance: He is well-developed.  HENT:     Head: Normocephalic and atraumatic.     Mouth/Throat:     Mouth: Mucous membranes are moist.     Pharynx: Oropharynx is clear.  Eyes:     Extraocular Movements: Extraocular movements intact.     Pupils: Pupils are equal, round, and reactive to light.  Cardiovascular:     Rate and Rhythm: Normal rate and regular rhythm.  Pulmonary:     Effort: Tachypnea and accessory muscle usage present.     Breath sounds: Wheezing present.  Abdominal:     General: Bowel sounds are normal.     Palpations: Abdomen is soft.  Musculoskeletal:        General: Normal range of motion.     Cervical  back: Normal range of motion and neck supple.  Skin:    General: Skin is warm.     Capillary Refill: Capillary refill takes less than 2 seconds.  Neurological:     General: No focal deficit present.     Mental Status: He is alert and oriented to person, place, and time.  Psychiatric:        Mood and Affect: Mood normal.        Behavior: Behavior normal.     ED Results / Procedures / Treatments   Labs (all labs ordered are listed, but only abnormal results are displayed) Labs Reviewed  BASIC METABOLIC PANEL - Abnormal; Notable for the following components:      Result Value   Sodium 134 (*)    Potassium 2.6 (*)    Chloride 96 (*)    Glucose, Bld 154 (*)    BUN <5 (*)    All other components within normal limits  CBC WITH DIFFERENTIAL/PLATELET - Abnormal; Notable for the following components:   RBC 6.06 (*)    Hemoglobin 17.6 (*)    All other components within normal limits  RESPIRATORY PANEL BY RT PCR (FLU A&B, COVID)  BRAIN NATRIURETIC PEPTIDE  MAGNESIUM  POC SARS CORONAVIRUS 2 AG -  ED    EKG EKG Interpretation  Date/Time:  Wednesday June 02 2019 13:07:34 EDT Ventricular Rate:  123 PR Interval:    QRS Duration: 89 QT Interval:  338 QTC Calculation: 484 R Axis:   -86 Text Interpretation: Sinus tachycardia Ventricular premature complex Left axis deviation Borderline prolonged QT interval Since last tracing rate faster Confirmed by [id number] [phone]) on 06/02/2019 1:17:44 PM   Radiology DG Chest Port 1 View  Result Date: 06/02/2019 CLINICAL DATA:  Shortness of breath and body aches for 5 days. History of asthma. EXAM: PORTABLE CHEST 1 VIEW COMPARISON:  Chest x-ray 10/12/2015 FINDINGS: The cardiac silhouette, mediastinal and hilar contours are within normal limits. The lungs are clear. Slight hyperinflation could suggest reactive airways disease. No infiltrates or effusions. The bony thorax is intact. IMPRESSION: Mild hyperinflation but no infiltrates or  effusions. Electronically Signed   By: 12/12/2015 M.D.   On: 06/02/2019 13:45    Procedures Procedures (including critical care time)  Medications Ordered in ED Medications  albuterol (PROVENTIL,VENTOLIN) solution continuous neb (0 mg/hr Nebulization Hold 06/02/19 1624)  LORazepam (ATIVAN) injection 1 mg (has no administration in  time range)  magnesium sulfate IVPB 2 g 50 mL (0 g Intravenous Stopped 06/02/19 1538)  acetaminophen (TYLENOL) tablet 1,000 mg (1,000 mg Oral Given 06/02/19 1355)  chlorpheniramine-HYDROcodone (TUSSIONEX) 10-8 MG/5ML suspension 5 mL (5 mLs Oral Given 06/02/19 1355)  potassium chloride SA (KLOR-CON) CR tablet 40 mEq (40 mEq Oral Given 06/02/19 1541)    ED Course  I have reviewed the triage vital signs and the nursing notes.  Pertinent labs & imaging results that were available during my care of the patient were reviewed by me and considered in my medical decision making (see chart for details).    MDM Rules/Calculators/A&P                     Wheezing has improved somewhat, but pt is still very sob.  POC Covid negative, but PCR is pending.  Pt is hypokalemic, so he is given 40 meq kdur in ED.  Pt d/w Dr. Cyndia Skeeters ([facility]) for admission.  CRITICAL CARE Performed by: Isla Pence   Total critical care time: 30 minutes  Critical care time was exclusive of separately billable procedures and treating other patients.  Critical care was necessary to treat or prevent imminent or life-threatening deterioration.  Critical care was time spent personally by me on the following activities: development of treatment plan with patient and/or surrogate as well as nursing, discussions with consultants, evaluation of patient's response to treatment, examination of patient, obtaining history from patient or surrogate, ordering and performing treatments and interventions, ordering and review of laboratory studies, ordering and review of radiographic studies, pulse oximetry  and re-evaluation of patient's condition.  Final Clinical Impression(s) / ED Diagnoses Final diagnoses:  Moderate persistent asthma with exacerbation  Hypokalemia    Rx / DC Orders ED Discharge Orders    None       Isla Pence, MD 06/02/19 [phone]

## 2019-06-02 NOTE — H&P (Signed)
History and Physical    Edward Kelley. EYC:[id number] DOB: 1970/06/26 DOA: 06/02/2019  PCP: Patient, No Pcp Per Patient coming from: Home.  Chief Complaint: Cough, shortness of breath and body ache  HPI: Edward Kelley. is a 49 y.o. male with history of asthma, HTN and anxiety not on medication, tobacco use disorder and alcohol use disorder presenting with cough, shortness of breath and body ache.  Symptoms started with scratchy throat about 4 days ago.  Then he developed cough and shortness of breath.  Shortness of breath and cough have gotten worse that prompted him to call EMS.  He reports coughing to the point he throws up.  He also reports myalgia, chills and diaphoresis.  He denies runny nose, fever, nausea, diarrhea, UTI symptoms or focal neuro symptoms.  Reports lower abdominal pain from coughing.  Denies known sick contact but he works [employer] certified [employer] and has been around students.   Patient received IV Solu-Medrol 125 mg and albuterol in route to ED.  Patient is not on any medication for his chronic medical issues.  Smokes about a pack a day since the age of 41.  Reports drinking about half a gallon liquor that lasted him about 4 days.  Denies history of alcohol withdrawal.  He denies recreational drug use.  He wishes to be full code.  In ED, SBP 167> 143.  DBP 150> 94.  Mild temp to 100.2.  96% on RA.  HR 115> 88.  Hgb 17.6. Na 134.  K2.6.  Mg 1.9.  EKG sinus tachycardia with PVCs and LAD.  COVID-19 antigen and PCR negative.  Influenza PCR negative.  CXR with mildly hyperinflated lungs but no infiltrates.  Received K. Dur 40 mEq x 1, antitussive and nebulizers.  Hospitalist service called for admission for asthma exacerbation and hypokalemia.  ROS All review of system negative except for pertinent positives and negatives as history of present illness above. PMH Past Medical History:  Diagnosis Date  . Anxiety   . Arthritis    "neck & shoulders" (10/12/2015)  . Asthma   . Bursitis      "neck & shoulders"  . Chronic back pain    "all over" (10/12/2015)  . Dental disease    advanced/notes 10/12/2015  . GERD (gastroesophageal reflux disease)   . Headache    "weekly" (10/12/2015)  . Kidney stone    "currently" (10/12/2015)  . Migraine    "weekly" (10/12/2015)   [place] Past Surgical History:  Procedure Laterality Date  . HERNIA REPAIR    . LAPAROSCOPIC APPENDECTOMY N/A 10/12/2015   Procedure: APPENDECTOMY LAPAROSCOPIC;  Surgeon: Greer Pickerel, MD;  Location: [place];  Service: General;  Laterality: N/A;  . WISDOM TOOTH EXTRACTION     Fam HX Denies family history of lung disease or PE.  Social Hx  reports that he has been smoking cigarettes. He has a 30.00 pack-year smoking history. He has never used smokeless tobacco. He reports current alcohol use. He reports current drug use. Drug: Marijuana.  Allergy Allergies  Allergen Reactions  . Codeine Itching   Home Meds Prior to Admission medications   Medication Sig Start Date End Date Taking? Authorizing Provider  acetaminophen (TYLENOL) 325 MG tablet Take 2 tablets (650 mg total) by mouth every 6 (six) hours as needed for mild pain or moderate pain (Do not take more than 4000 mg of Tylenol(acetaminophen) per day.). 10/13/15   Earnstine Regal, PA-C  ibuprofen (ADVIL,MOTRIN) 200 MG tablet You can safely take 2-3 every  6 hours for pain.  If you are using this chronically it can cause ulcers.  It can also cause renal failure. 10/13/15   Sherrie George, PA-C  nicotine (NICODERM CQ - DOSED IN MG/24 HOURS) 21 mg/24hr patch You can buy this over the counter, and follow instructions to help with smoking cessation. 10/13/15   Sherrie George, PA-C  oxyCODONE (OXY IR/ROXICODONE) 5 MG immediate release tablet Take 1-2 tablets (5-10 mg total) by mouth every 4 (four) hours as needed for moderate pain. 10/13/15   Sherrie George, PA-C    Physical Exam: Vitals:   06/02/19 1530 06/02/19 1600 06/02/19 1630 06/02/19 1700  BP: (!) 143/97 (!)  138/96 (!) 162/112 (!) 166/94  Pulse: 90 85 99 (!) 108  Resp: 13 18 19 17   Temp:      TempSrc:      SpO2: 96% 95% 94% 93%  Weight:      Height:        GENERAL: No acute distress.  Appears well.  HEENT: MMM.  No notable oropharyngeal lesion. NECK: Supple.  No LAD.  No nuchal stiffness. RESP: On RA.  Notable IWOB.  Rhonchi bilaterally.  No crackles or wheeze. CVS:  RRR. Heart sounds normal.  ABD/GI/GU: Bowel sounds present. Soft. Non tender.  MSK/EXT:  Moves extremities. No apparent deformity or edema.  SKIN: no apparent skin lesion or wound NEURO: Awake, alert and oriented appropriately.  No gross deficit.  PSYCH: Calm. Normal affect.   Personally Reviewed Radiological Exams DG Chest Port 1 View  Result Date: 06/02/2019 CLINICAL DATA:  Shortness of breath and body aches for 5 days. History of asthma. EXAM: PORTABLE CHEST 1 VIEW COMPARISON:  Chest x-ray 10/12/2015 FINDINGS: The cardiac silhouette, mediastinal and hilar contours are within normal limits. The lungs are clear. Slight hyperinflation could suggest reactive airways disease. No infiltrates or effusions. The bony thorax is intact. IMPRESSION: Mild hyperinflation but no infiltrates or effusions. Electronically Signed   By: 12/12/2015 M.D.   On: 06/02/2019 13:45     Personally Reviewed Labs: CBC: Recent Labs  Lab 06/02/19 1315  WBC 4.9  NEUTROABS 2.7  HGB 17.6*  HCT 49.8  MCV 82.2  PLT 195   Basic Metabolic Panel: Recent Labs  Lab 06/02/19 1315 06/02/19 1540  NA 134*  --   K 2.6*  --   CL 96*  --   CO2 23  --   GLUCOSE 154*  --   BUN <5*  --   CREATININE 1.10  --   CALCIUM 8.9  --   MG  --  1.9   GFR: Estimated Creatinine Clearance: 84 mL/min (by C-G formula based on SCr of 1.1 mg/dL). Liver Function Tests: No results for input(s): AST, ALT, ALKPHOS, BILITOT, PROT, ALBUMIN in the last 168 hours. No results for input(s): LIPASE, AMYLASE in the last 168 hours. No results for input(s): AMMONIA in the  last 168 hours. Coagulation Profile: No results for input(s): INR, PROTIME in the last 168 hours. Cardiac Enzymes: No results for input(s): CKTOTAL, CKMB, CKMBINDEX, TROPONINI in the last 168 hours. BNP (last 3 results) No results for input(s): PROBNP in the last 8760 hours. HbA1C: No results for input(s): HGBA1C in the last 72 hours. CBG: No results for input(s): GLUCAP in the last 168 hours. Lipid Profile: No results for input(s): CHOL, HDL, LDLCALC, TRIG, CHOLHDL, LDLDIRECT in the last 72 hours. Thyroid Function Tests: No results for input(s): TSH, T4TOTAL, FREET4, T3FREE, THYROIDAB in the last 72 hours. Anemia  Panel: No results for input(s): VITAMINB12, FOLATE, FERRITIN, TIBC, IRON, RETICCTPCT in the last 72 hours. Urine analysis:    Component Value Date/Time   COLORURINE YELLOW 10/12/2015 1132   APPEARANCEUR CLEAR 10/12/2015 1132   LABSPEC 1.020 10/12/2015 1132   PHURINE 6.0 10/12/2015 1132   GLUCOSEU NEGATIVE 10/12/2015 1132   HGBUR SMALL (A) 10/12/2015 1132   BILIRUBINUR NEGATIVE 10/12/2015 1132   KETONESUR NEGATIVE 10/12/2015 1132   PROTEINUR NEGATIVE 10/12/2015 1132   NITRITE NEGATIVE 10/12/2015 1132   LEUKOCYTESUR NEGATIVE 10/12/2015 1132    Sepsis Labs:  No leukocytosis  Personally Reviewed EKG:  Sinus tachycardia with PVCs and LAD  Assessment/Plan Acute asthma exacerbation-likely due to tobacco and possible viral URI.  Influenza, COVID-19 antigen and PCR negative.  CXR with some hyperinflation but no infiltrate.  BNP within normal.  Patient with significant work of breathing during my exam. -Received Solu-Medrol by EMS on the way to ED -Solu-Medrol, budesonide, scheduled DuoNeb with as needed albuterol -Mucolytic's and antitussives  Hypertensive urgency-not on medication at home.  -Start amlodipine 10 mg daily -As needed metoprolol for SBP above 160 or DBP above 110 -Check renin and aldosterone levels -Consider renal Doppler if no  improvement.  Hyponatremia-beer potomania? -Continue monitoring.  Hypokalemia-could be due to alcohol.  Magnesium within normal. -Replenish and recheck -Check urine sodium and potassium -Check aldosterone/renin activity  Anxiety -As needed Ativan  Tobacco use disorder: About 31-pack-year history.  Smokes about a pack a day. -Encouraged cessation -Nicotine patch  Alcohol use disorder: Quantifies as half a gallon liquor over 4 days. -Encouraged moderation. -CIWA with Ativan and multivitamins  Hyponatremia  DVT prophylaxis: Subcu Lovenox  Code Status: Full code Family Communication: Patient updated family  Disposition Plan: Admit to medical telemetry Consults called: None Admission status: Observation   Almon Hercules MD [facility]  If 7PM-7AM, please contact night-coverage [email or web address] Password [facility]  06/02/2019, 5:31 PM

## 2019-06-02 NOTE — ED Triage Notes (Signed)
Pt here from home via [facility].  States cough since Thursday and body aches by Sat.  Hx of asthma.  Given 125 solumedrol, 10 albuterol and 0.5 atrovent en-route.

## 2019-06-03 DIAGNOSIS — J4541 Moderate persistent asthma with (acute) exacerbation: Principal | ICD-10-CM

## 2019-06-03 LAB — CBC
HCT: 44.8 % (ref 39.0–52.0)
Hemoglobin: 16.1 g/dL (ref 13.0–17.0)
MCH: 29.5 pg (ref 26.0–34.0)
MCHC: 35.9 g/dL (ref 30.0–36.0)
MCV: 82.1 fL (ref 80.0–100.0)
Platelets: 185 10*3/uL (ref 150–400)
RBC: 5.46 MIL/uL (ref 4.22–5.81)
RDW: 13.5 % (ref 11.5–15.5)
WBC: 6.3 10*3/uL (ref 4.0–10.5)
nRBC: 0 % (ref 0.0–0.2)

## 2019-06-03 LAB — HEMOGLOBIN A1C
Hgb A1c MFr Bld: 6.1 % — ABNORMAL HIGH (ref 4.8–5.6)
Mean Plasma Glucose: 128.37 mg/dL

## 2019-06-03 LAB — BASIC METABOLIC PANEL
Anion gap: 15 (ref 5–15)
BUN: 7 mg/dL (ref 6–20)
CO2: 18 mmol/L — ABNORMAL LOW (ref 22–32)
Calcium: 8.8 mg/dL — ABNORMAL LOW (ref 8.9–10.3)
Chloride: 99 mmol/L (ref 98–111)
Creatinine, Ser: 1.02 mg/dL (ref 0.61–1.24)
GFR calc Af Amer: >60 mL/min (ref >60)
GFR calc non Af Amer: >60 mL/min (ref >60)
Glucose, Bld: 152 mg/dL — ABNORMAL HIGH (ref 70–99)
Potassium: 3.2 mmol/L — ABNORMAL LOW (ref 3.5–5.1)
Sodium: 132 mmol/L — ABNORMAL LOW (ref 135–145)

## 2019-06-03 MED ORDER — ALBUTEROL SULFATE HFA 108 (90 BASE) MCG/ACT IN AERS: 2.0000 | INHALATION_SPRAY | Freq: Four times a day (QID) | RESPIRATORY_TRACT | 0 refills | Status: DC | PRN

## 2019-06-03 MED ORDER — PREDNISONE 10 MG PO TABS: 40.0000 mg | ORAL_TABLET | Freq: Every day | ORAL | 0 refills | Status: AC

## 2019-06-03 NOTE — Progress Notes (Signed)
Patient chose to leave AMA prior to me being able to see him.  He understands the risks.  I sent in burst of steroids and inhaler prescriptions to help bridge until patient he can follow up with his PCP Edward Canary DO

## 2019-06-03 NOTE — Plan of Care (Signed)

## 2019-06-03 NOTE — Progress Notes (Signed)
Pt stated he wants to leave now. He said he has a dog at home and would not have a place to live if does not go home at this time. Explained to Pt the benefits and risks of leaving AMA but Pt was adamant to leave now. MD updated, charge nurse made aware, IV x2 removed and telemonitor returned. Pt left at 07:50 with all his belongings.

## 2019-06-14 LAB — ALDOSTERONE + RENIN ACTIVITY W/ RATIO
ALDO / PRA Ratio: 1.3 (ref 0.0–30.0)
Aldosterone: 22.3 ng/dL (ref 0.0–30.0)
PRA LC/MS/MS: 17.717 ng/mL/hr — ABNORMAL HIGH (ref 0.167–5.380)

## 2019-06-22 ENCOUNTER — Emergency Department (HOSPITAL_COMMUNITY): Payer: Self-pay

## 2019-06-22 ENCOUNTER — Emergency Department (HOSPITAL_COMMUNITY)
Admission: EM | Admit: 2019-06-22 | Discharge: 2019-06-22 | Disposition: A | Discharge status: Home / Self Care | Payer: Self-pay | Attending: Emergency Medicine | Admitting: Emergency Medicine

## 2019-06-22 ENCOUNTER — Encounter (HOSPITAL_COMMUNITY): Payer: Self-pay | Admitting: Emergency Medicine

## 2019-06-22 ENCOUNTER — Other Ambulatory Visit: Payer: Self-pay

## 2019-06-22 DIAGNOSIS — R110 Nausea: Secondary | ICD-10-CM | POA: Insufficient documentation

## 2019-06-22 DIAGNOSIS — R1031 Right lower quadrant pain: Secondary | ICD-10-CM | POA: Insufficient documentation

## 2019-06-22 DIAGNOSIS — R1011 Right upper quadrant pain: Secondary | ICD-10-CM | POA: Insufficient documentation

## 2019-06-22 DIAGNOSIS — R109 Unspecified abdominal pain: Secondary | ICD-10-CM

## 2019-06-22 DIAGNOSIS — F17210 Nicotine dependence, cigarettes, uncomplicated: Secondary | ICD-10-CM | POA: Insufficient documentation

## 2019-06-22 LAB — COMPREHENSIVE METABOLIC PANEL
ALT: 68 U/L — ABNORMAL HIGH (ref 0–44)
AST: 41 U/L (ref 15–41)
Albumin: 3.7 g/dL (ref 3.5–5.0)
Alkaline Phosphatase: 51 U/L (ref 38–126)
Anion gap: 12 (ref 5–15)
BUN: 11 mg/dL (ref 6–20)
CO2: 21 mmol/L — ABNORMAL LOW (ref 22–32)
Calcium: 9.1 mg/dL (ref 8.9–10.3)
Chloride: 102 mmol/L (ref 98–111)
Creatinine, Ser: 0.98 mg/dL (ref 0.61–1.24)
GFR calc Af Amer: >60 mL/min (ref >60)
GFR calc non Af Amer: >60 mL/min (ref >60)
Glucose, Bld: 98 mg/dL (ref 70–99)
Potassium: 4 mmol/L (ref 3.5–5.1)
Sodium: 135 mmol/L (ref 135–145)
Total Bilirubin: 0.6 mg/dL (ref 0.3–1.2)
Total Protein: 7.3 g/dL (ref 6.5–8.1)

## 2019-06-22 LAB — CBC
HCT: 45.7 % (ref 39.0–52.0)
Hemoglobin: 15.4 g/dL (ref 13.0–17.0)
MCH: 29.3 pg (ref 26.0–34.0)
MCHC: 33.7 g/dL (ref 30.0–36.0)
MCV: 87 fL (ref 80.0–100.0)
Platelets: 247 10*3/uL (ref 150–400)
RBC: 5.25 MIL/uL (ref 4.22–5.81)
RDW: 13.3 % (ref 11.5–15.5)
WBC: 8.3 10*3/uL (ref 4.0–10.5)
nRBC: 0 % (ref 0.0–0.2)

## 2019-06-22 LAB — URINALYSIS, ROUTINE W REFLEX MICROSCOPIC
Bilirubin Urine: NEGATIVE
Glucose, UA: NEGATIVE mg/dL
Hgb urine dipstick: NEGATIVE
Ketones, ur: NEGATIVE mg/dL
Leukocytes,Ua: NEGATIVE
Nitrite: NEGATIVE
Protein, ur: NEGATIVE mg/dL
Specific Gravity, Urine: 1.004 — ABNORMAL LOW (ref 1.005–1.030)
pH: 7 (ref 5.0–8.0)

## 2019-06-22 LAB — LIPASE, BLOOD: Lipase: 36 U/L (ref 11–51)

## 2019-06-22 MED ORDER — ALUM & MAG HYDROXIDE-SIMETH 200-200-20 MG/5ML PO SUSP
30.0000 mL | Freq: Once | ORAL | Status: AC
  Administered 2019-06-22: 18:00:00 30 mL via ORAL
  Filled 2019-06-22: qty 30

## 2019-06-22 MED ORDER — ONDANSETRON HCL 4 MG PO TABS: 4.0000 mg | ORAL_TABLET | Freq: Three times a day (TID) | ORAL | 0 refills | Status: DC | PRN

## 2019-06-22 NOTE — ED Notes (Addendum)
Pt resting comfortably, reports constant dull pain on RLQ, with sharp pains  Intermittently. Reports history of appendicitis w/ appendectomy. Also reports recent admission for respiratory infection, cough and sore throat lingering. Bed low & locked, call bell within reach, warm blankets offered.

## 2019-06-22 NOTE — ED Triage Notes (Signed)
Patient states he was seen here a couple weeks ago with respiratory infection. States breathing has improved but now having raspy voice. Has chronic smokers cough. Also c/o lower right quadrant abdominal pain x 4 days. Hx of appendectomy.

## 2019-06-22 NOTE — Care Management (Signed)
ED CM met with patient at bedside to assist with finding a PCP for f/u care. Discussed the [facility] patient is agreeable and information will be sent to clinic's CM and provide patient to reach out if he does not receive a call back.

## 2019-06-22 NOTE — ED Provider Notes (Signed)
[facility] EMERGENCY DEPARTMENT Provider Note   CSN: [id number] Arrival date & time: 06/22/19  1402     History Chief Complaint  Patient presents with  . Abdominal Pain    Edward Kelley. is a 49 y.o. male.  HPI HPI Comments: Edward Kelley. is a 49 y.o. male with history of asthma and anxiety who presents to the Emergency Department complaining of worsening right upper abdominal pain for 4 days.  Patient describes his pain as a 7/10, waxing and waning, better with defecation.  He notes a history of appendectomy.  He states his pain will sometimes worsen and intermittently radiate across the abdomen.  He took some aspirin for his pain without relief.  He notes associated intermittent nausea but none currently, one-time vomiting this morning which he describes as "food", abdominal distention and bloating.  He smokes 1 pack/day and states he will drink about 1 pint of vodka on average per week.  He denies fevers, chills, URI symptoms, acute shortness of breath, chest pain, hemoptysis, hematemesis, diarrhea, hematochezia, dysuria, hematuria, syncope.  Patient additionally notes that he was recently admitted for shortness of breath.  He states that his symptoms have alleviated and denies any current shortness of breath.  Since this visit he states that he "lost his voice" for about 3 days and then when regaining his voice states "it now sounds raspy".  One of his relatives notes that their friend who had esophageal cancer had a similar sounding voice.  Patient denies any hemoptysis, odynophagia, dysphagia.  As mentioned above, he smokes about a pack per day.  He does not have a primary care provider and is not insured.    Past Medical History:  Diagnosis Date  . Anxiety   . Arthritis    "neck & shoulders" (10/12/2015)  . Asthma   . Bursitis    "neck & shoulders"  . Chronic back pain    "all over" (10/12/2015)  . Dental disease    advanced/notes 10/12/2015  . GERD  (gastroesophageal reflux disease)   . Headache    "weekly" (10/12/2015)  . Kidney stone    "currently" (10/12/2015)  . Migraine    "weekly" (10/12/2015)    Patient Active Problem List   Diagnosis Date Noted  . Asthma exacerbation 06/02/2019  . Acute appendicitis 10/12/2015    Past Surgical History:  Procedure Laterality Date  . HERNIA REPAIR    . LAPAROSCOPIC APPENDECTOMY N/A 10/12/2015   Procedure: APPENDECTOMY LAPAROSCOPIC;  Surgeon: Gaynelle Adu, MD;  Location: [facility] OR;  Service: General;  Laterality: N/A;  . WISDOM TOOTH EXTRACTION         No family history on file.  Social History   Tobacco Use  . Smoking status: Current Every Day Smoker    Packs/day: 1.00    Years: 30.00    Pack years: 30.00    Types: Cigarettes  . Smokeless tobacco: Never Used  Substance Use Topics  . Alcohol use: Yes    Comment: 10/12/2015 "1/2 gallon vodka twice/month"  . Drug use: Yes    Types: Marijuana    Home Medications Prior to Admission medications   Medication Sig Start Date End Date Taking? Authorizing Provider  acetaminophen (TYLENOL) 500 MG tablet Take 500 mg by mouth daily as needed for headache.   Yes [provider]  albuterol (VENTOLIN HFA) 108 (90 Base) MCG/ACT inhaler Inhale 2 puffs into the lungs every 6 (six) hours as needed for wheezing. 06/03/19  Yes Benjamine Mola,  Tomi Bamberger, DO  Multiple Vitamins-Minerals (EMERGEN-C BLUE) PACK Take 1 Package by mouth daily.   Yes [provider]  Propylene Glycol (SYSTANE BALANCE OP) Place 1 drop into both eyes daily as needed (for dry eyes).   Yes [provider]    Allergies    Codeine  Review of Systems   Review of Systems  All other systems reviewed and are negative.  Ten systems reviewed and are negative for acute change, except as noted in the HPI.   Physical Exam Updated Vital Signs BP (!) 149/114   Pulse 85   Temp 98.7 F (37.1 C) (Oral)   Resp 18   Ht 5\' 7"  (1.702 m)   Wt 81.6 kg   SpO2 98%   BMI 28.19  kg/m   Physical Exam Vitals and nursing note reviewed.  Constitutional:      General: He is not in acute distress.    Appearance: He is well-developed and normal weight. He is not ill-appearing, toxic-appearing or diaphoretic.  HENT:     Head: Normocephalic and atraumatic.     Mouth/Throat:     Mouth: Mucous membranes are moist.     Pharynx: Oropharynx is clear. No pharyngeal swelling or oropharyngeal exudate.     Comments: Diffuse red streaking noted in the posterior oropharynx. Eyes:     General: No scleral icterus.    Extraocular Movements: Extraocular movements intact.     Pupils: Pupils are equal, round, and reactive to light.  Cardiovascular:     Rate and Rhythm: Normal rate and regular rhythm.     Heart sounds: Normal heart sounds. No murmur. No friction rub. No gallop.   Pulmonary:     Effort: Pulmonary effort is normal. No respiratory distress.     Breath sounds: Normal breath sounds. No stridor. No wheezing or rales.  Chest:     Chest wall: No tenderness.  Abdominal:     General: Abdomen is protuberant. Bowel sounds are normal. There is distension.     Tenderness: There is no abdominal tenderness. There is no right CVA tenderness, left CVA tenderness, guarding or rebound.     Comments: No tenderness appreciated in all 4 abdominal quadrants with deep palpation.  Abdomen is mildly distended with increased tympany noted with percussion.  Skin:    General: Skin is warm and dry.  Neurological:     General: No focal deficit present.     Mental Status: He is alert and oriented to person, place, and time.  Psychiatric:        Mood and Affect: Mood normal.        Behavior: Behavior normal.    ED Results / Procedures / Treatments   Labs (all labs ordered are listed, but only abnormal results are displayed) Labs Reviewed  COMPREHENSIVE METABOLIC PANEL - Abnormal; Notable for the following components:      Result Value   CO2 21 (*)    ALT 68 (*)    All other components  within normal limits  URINALYSIS, ROUTINE W REFLEX MICROSCOPIC - Abnormal; Notable for the following components:   Color, Urine STRAW (*)    Specific Gravity, Urine 1.004 (*)    All other components within normal limits  LIPASE, BLOOD  CBC    EKG None  Radiology DG Chest 2 View  Result Date: 06/22/2019 CLINICAL DATA:  Abdominal pain EXAM: CHEST - 2 VIEW COMPARISON:  06/02/2019 FINDINGS: The heart size and mediastinal contours are within normal limits. No focal airspace  consolidation, pleural effusion, or pneumothorax. The visualized skeletal structures are unremarkable. IMPRESSION: No active cardiopulmonary disease. Electronically Signed   By: Duanne Guess D.O.   On: 06/22/2019 15:22    Procedures Procedures   Medications Ordered in ED Medications  alum & mag hydroxide-simeth (MAALOX/MYLANTA) 200-200-20 MG/5ML suspension 30 mL (30 mLs Oral Given 06/22/19 1742)   ED Course  I have reviewed the triage vital signs and the nursing notes.  Pertinent labs & imaging results that were available during my care of the patient were reviewed by me and considered in my medical decision making (see chart for details).    MDM Rules/Calculators/A&P                      Patient is a pleasant 49 year old male who presents with right-sided abdominal pain and sore throat.  We discussed his medical history as well as his history of drinking and smoking.  We discussed cessation of both in length.  He was concerned for esophageal cancer but due to a lack of health insurance will not follow-up with GI if given a referral.  I placed a social consult for this patient.  Patient also reports 4 days of worsening right-sided abdominal pain.  Physical exam is reassuring and patient has no focal tenderness with deep palpation.  Basic labs and UA are additionally reassuring.  He was given a GI cocktail and notes he had 2 BMs which did provide some mild relief.  I gave him a short course of Zofran to take as  needed.  He was given strict return precautions and understands that he can return to the emergency department with any new or worsening symptoms.  He verbalized understanding the above plan was amicable the time of discharge.  Vital signs stable.  Patient discharged to home/self care.  Condition at discharge: Stable  Note: Portions of this report may have been transcribed using voice recognition software. Every effort was made to ensure accuracy; however, inadvertent computerized transcription errors may be present.      Final Clinical Impression(s) / ED Diagnoses Final diagnoses:  Abdominal pain, unspecified abdominal location  Nausea    Rx / DC Orders ED Discharge Orders         Ordered    ondansetron (ZOFRAN) 4 MG tablet  Every 8 hours PRN     06/22/19 1923           Placido Sou, Cordelia Poche 06/22/19 2007    Tilden Fossa, MD 06/22/19 [phone]

## 2019-06-22 NOTE — Discharge Instructions (Addendum)
Per discussion, you are being prescribed Zofran for nausea.  Please take this as needed for worsening nausea symptoms.  Do not exceed more than 3 doses in 1 day.  Please do not hesitate to return the emergency department with any new or worsening symptoms.  It was a pleasure to meet you.

## 2019-07-02 ENCOUNTER — Ambulatory Visit: Payer: MEDICAID | Attending: Internal Medicine

## 2019-07-02 ENCOUNTER — Other Ambulatory Visit: Payer: Self-pay

## 2019-07-02 DIAGNOSIS — Z23 Encounter for immunization: Secondary | ICD-10-CM

## 2019-07-02 NOTE — Progress Notes (Signed)
   [id number]  Name:  Clevester Helzer.    MRN: [id number] DOB: [birth year]  07/02/2019  Mr. Belfield was observed post Covid-19 immunization for 15 minutes without incident. He was provided with Vaccine Information Sheet and instruction to access the V-Safe system.   Mr. Griep was instructed to call 911 with any severe reactions post vaccine: Marland Kitchen Difficulty breathing  . Swelling of face and throat  . A fast heartbeat  . A bad rash all over body  . Dizziness and weakness   Immunizations Administered    Name Date Dose VIS Date Route   Pfizer COVID-19 Vaccine 07/02/2019  9:48 AM 0.3 mL 05/05/2018 Intramuscular   Manufacturer: [employer], [employer]   Lot: [id number]   NDC: [address]-3

## 2019-07-27 ENCOUNTER — Ambulatory Visit: Payer: MEDICAID | Attending: Internal Medicine

## 2019-07-27 DIAGNOSIS — Z23 Encounter for immunization: Secondary | ICD-10-CM

## 2019-07-27 NOTE — Progress Notes (Signed)
   [id number]  Name:  Indio Santilli.    MRN: [id number] DOB: 17-Dec-1970  07/27/2019  Mr. Rockers was observed post Covid-19 immunization for 15 minutes without incident. He was provided with Vaccine Information Sheet and instruction to access the V-Safe system.   Mr. Tulloch was instructed to call 911 with any severe reactions post vaccine: Marland Kitchen Difficulty breathing  . Swelling of face and throat  . A fast heartbeat  . A bad rash all over body  . Dizziness and weakness   Immunizations Administered    Name Date Dose VIS Date Route   Pfizer COVID-19 Vaccine 07/27/2019 11:00 AM 0.3 mL 05/05/2018 Intramuscular   Manufacturer: [employer], [employer]   Lot: [id number]   NDC: [address]-6

## 2019-07-31 ENCOUNTER — Ambulatory Visit: Payer: MEDICAID | Attending: Internal Medicine

## 2020-06-01 ENCOUNTER — Emergency Department (HOSPITAL_COMMUNITY): Payer: Self-pay

## 2020-06-01 ENCOUNTER — Encounter (HOSPITAL_COMMUNITY): Payer: Self-pay | Admitting: Emergency Medicine

## 2020-06-01 ENCOUNTER — Emergency Department (HOSPITAL_COMMUNITY)
Admission: EM | Admit: 2020-06-01 | Discharge: 2020-06-01 | Disposition: A | Discharge status: Home / Self Care | Payer: Self-pay | Attending: Emergency Medicine | Admitting: Emergency Medicine

## 2020-06-01 DIAGNOSIS — F17210 Nicotine dependence, cigarettes, uncomplicated: Secondary | ICD-10-CM | POA: Insufficient documentation

## 2020-06-01 DIAGNOSIS — R079 Chest pain, unspecified: Secondary | ICD-10-CM | POA: Insufficient documentation

## 2020-06-01 DIAGNOSIS — M546 Pain in thoracic spine: Secondary | ICD-10-CM | POA: Insufficient documentation

## 2020-06-01 DIAGNOSIS — J45909 Unspecified asthma, uncomplicated: Secondary | ICD-10-CM | POA: Insufficient documentation

## 2020-06-01 LAB — URINALYSIS, ROUTINE W REFLEX MICROSCOPIC
Bacteria, UA: NONE SEEN
Bilirubin Urine: NEGATIVE
Glucose, UA: NEGATIVE mg/dL
Hgb urine dipstick: NEGATIVE
Ketones, ur: NEGATIVE mg/dL
Nitrite: NEGATIVE
Protein, ur: NEGATIVE mg/dL
Specific Gravity, Urine: 1.018 (ref 1.005–1.030)
pH: 6 (ref 5.0–8.0)

## 2020-06-01 LAB — BASIC METABOLIC PANEL
Anion gap: 9 (ref 5–15)
BUN: 11 mg/dL (ref 6–20)
CO2: 21 mmol/L — ABNORMAL LOW (ref 22–32)
Calcium: 9.2 mg/dL (ref 8.9–10.3)
Chloride: 104 mmol/L (ref 98–111)
Creatinine, Ser: 0.97 mg/dL (ref 0.61–1.24)
GFR, Estimated: >60 mL/min (ref >60)
Glucose, Bld: 110 mg/dL — ABNORMAL HIGH (ref 70–99)
Potassium: 4.2 mmol/L (ref 3.5–5.1)
Sodium: 134 mmol/L — ABNORMAL LOW (ref 135–145)

## 2020-06-01 LAB — CBC WITH DIFFERENTIAL/PLATELET
Abs Immature Granulocytes: 0.02 10*3/uL (ref 0.00–0.07)
Basophils Absolute: 0.1 10*3/uL (ref 0.0–0.1)
Basophils Relative: 2 %
Eosinophils Absolute: 0.4 10*3/uL (ref 0.0–0.5)
Eosinophils Relative: 5 %
HCT: 46.6 % (ref 39.0–52.0)
Hemoglobin: 15.8 g/dL (ref 13.0–17.0)
Immature Granulocytes: 0 %
Lymphocytes Relative: 29 %
Lymphs Abs: 2 10*3/uL (ref 0.7–4.0)
MCH: 29.5 pg (ref 26.0–34.0)
MCHC: 33.9 g/dL (ref 30.0–36.0)
MCV: 86.9 fL (ref 80.0–100.0)
Monocytes Absolute: 0.6 10*3/uL (ref 0.1–1.0)
Monocytes Relative: 9 %
Neutro Abs: 3.8 10*3/uL (ref 1.7–7.7)
Neutrophils Relative %: 55 %
Platelets: 239 10*3/uL (ref 150–400)
RBC: 5.36 MIL/uL (ref 4.22–5.81)
RDW: 13 % (ref 11.5–15.5)
WBC: 6.9 10*3/uL (ref 4.0–10.5)
nRBC: 0 % (ref 0.0–0.2)

## 2020-06-01 LAB — TROPONIN I (HIGH SENSITIVITY)
Troponin I (High Sensitivity): 4 ng/L (ref <18)
Troponin I (High Sensitivity): 3 ng/L (ref <18)

## 2020-06-01 MED ORDER — CYCLOBENZAPRINE HCL 10 MG PO TABS: 10.0000 mg | ORAL_TABLET | Freq: Two times a day (BID) | ORAL | 0 refills | Status: DC | PRN

## 2020-06-01 MED ORDER — PREDNISONE 20 MG PO TABS: 20.0000 mg | ORAL_TABLET | Freq: Every day | ORAL | 0 refills | Status: AC

## 2020-06-01 MED ORDER — ONDANSETRON HCL 4 MG/2ML IJ SOLN
4.0000 mg | Freq: Once | INTRAMUSCULAR | Status: AC
  Administered 2020-06-01: 4 mg via INTRAVENOUS
  Filled 2020-06-01: qty 2

## 2020-06-01 MED ORDER — CYCLOBENZAPRINE HCL 10 MG PO TABS
10.0000 mg | ORAL_TABLET | Freq: Once | ORAL | Status: AC
  Administered 2020-06-01: 10 mg via ORAL
  Filled 2020-06-01: qty 1

## 2020-06-01 MED ORDER — MORPHINE SULFATE (PF) 4 MG/ML IV SOLN
4.0000 mg | Freq: Once | INTRAVENOUS | Status: AC
  Administered 2020-06-01: 4 mg via INTRAVENOUS
  Filled 2020-06-01: qty 1

## 2020-06-01 NOTE — ED Provider Notes (Signed)
[facility] EMERGENCY DEPARTMENT Provider Note   CSN: [id number] Arrival date & time: 06/01/20  0086     History No chief complaint on file.   Edward Kelley. is a 50 y.o. male.  HPI   Patient with no significant medical history presents the emergency department with chief complaint of left upper back pain.  He endorses this started approximately 1 week ago on Tuesday after he had had a great sneeze at nighttime.  Since then he has been having left-sided back pain.  Describes it as a sharp sensation which does not radiate, he denies paresthesias or weakness in the upper or lower extremities, urinary incontinency, urinary retention, difficult bowel movements.  He denies IV drug use, denies autoimmune diseases, he has had pain like this in the past which has gone away on its own.  He endorses moving in certain positions tend to make the pain worse, lying down increases it, standing tends to make it better.  He endorses today he started to feel the pain radiating into the front of his chest, it is not associated with shortness of breath, becoming diaphoretic, lightheadedness or dizziness, denies nausea or vomiting.  Patient has no cardiac history, no history of PEs or DVTs, currently not on hormone therapy.  Patient denies headaches, fevers, chills, shortness of breath, abdominal pain, nausea, vomiting, diarrhea, urinary symptoms, worsening pedal edema.  Past Medical History:  Diagnosis Date  . Anxiety   . Arthritis    "neck & shoulders" (10/12/2015)  . Asthma   . Bursitis    "neck & shoulders"  . Chronic back pain    "all over" (10/12/2015)  . Dental disease    advanced/notes 10/12/2015  . GERD (gastroesophageal reflux disease)   . Headache    "weekly" (10/12/2015)  . Kidney stone    "currently" (10/12/2015)  . Migraine    "weekly" (10/12/2015)    Patient Active Problem List   Diagnosis Date Noted  . Asthma exacerbation 06/02/2019  . Acute appendicitis 10/12/2015     Past Surgical History:  Procedure Laterality Date  . HERNIA REPAIR    . LAPAROSCOPIC APPENDECTOMY N/A 10/12/2015   Procedure: APPENDECTOMY LAPAROSCOPIC;  Surgeon: Gaynelle Adu, MD;  Location: [facility] OR;  Service: General;  Laterality: N/A;  . WISDOM TOOTH EXTRACTION         No family history on file.  Social History   Tobacco Use  . Smoking status: Current Every Day Smoker    Packs/day: 1.00    Years: 30.00    Pack years: 30.00    Types: Cigarettes  . Smokeless tobacco: Never Used  Substance Use Topics  . Alcohol use: Yes    Comment: 10/12/2015 "1/2 gallon vodka twice/month"  . Drug use: Yes    Types: Marijuana    Home Medications Prior to Admission medications   Medication Sig Start Date End Date Taking? Authorizing Provider  cyclobenzaprine (FLEXERIL) 10 MG tablet Take 1 tablet (10 mg total) by mouth 2 (two) times daily as needed for muscle spasms. 06/01/20  Yes Carroll Sage, PA-C  predniSONE (DELTASONE) 20 MG tablet Take 1 tablet (20 mg total) by mouth daily for 5 days. 06/01/20 06/06/20 Yes Carroll Sage, PA-C  acetaminophen (TYLENOL) 500 MG tablet Take 500 mg by mouth daily as needed for headache.    [provider]  albuterol (VENTOLIN HFA) 108 (90 Base) MCG/ACT inhaler Inhale 2 puffs into the lungs every 6 (six) hours as needed for wheezing. 06/03/19  Joseph Art, DO  Multiple Vitamins-Minerals (EMERGEN-C BLUE) PACK Take 1 Package by mouth daily.    [provider]  ondansetron (ZOFRAN) 4 MG tablet Take 1 tablet (4 mg total) by mouth every 8 (eight) hours as needed for nausea or vomiting. 06/22/19   Placido Sou, PA-C  Propylene Glycol (SYSTANE BALANCE OP) Place 1 drop into both eyes daily as needed (for dry eyes).    [provider]    Allergies    Codeine  Review of Systems   Review of Systems  Constitutional: Negative for chills and fever.  HENT: Negative for congestion and tinnitus.   Respiratory: Negative for  shortness of breath.   Cardiovascular: Positive for chest pain.  Gastrointestinal: Negative for abdominal pain, blood in stool and vomiting.  Genitourinary: Positive for frequency. Negative for difficulty urinating and enuresis.  Musculoskeletal: Positive for back pain.  Skin: Negative for rash.  Neurological: Negative for dizziness and headaches.  Hematological: Does not bruise/bleed easily.    Physical Exam Updated Vital Signs BP (!) 140/98 (BP Location: Right Arm)   Pulse 64   Temp 98.2 F (36.8 C)   Resp 11   SpO2 96%   Physical Exam Vitals and nursing note reviewed.  Constitutional:      General: He is not in acute distress.    Appearance: He is not ill-appearing.  HENT:     Head: Normocephalic and atraumatic.     Nose: No congestion.  Eyes:     Conjunctiva/sclera: Conjunctivae normal.  Cardiovascular:     Rate and Rhythm: Normal rate and regular rhythm.     Pulses: Normal pulses.     Heart sounds: No murmur heard. No friction rub. No gallop.      Comments: Patient's chest was palpated, he had tenderness to palpation along his fourth and fifth rib midclavicular on the left side, there is no crepitus or deformities present.  Chest pain was reproducible. Pulmonary:     Effort: No respiratory distress.     Breath sounds: No wheezing, rhonchi or rales.  Abdominal:     Palpations: Abdomen is soft.     Tenderness: There is no abdominal tenderness. There is no right CVA tenderness or left CVA tenderness.  Musculoskeletal:     Right lower leg: No edema.     Left lower leg: No edema.     Comments: Spine was palpated it was nontender to palpation, no step-off or deformities present.  Patient was notably tender in the musculature between medial border of the scapula and spine, pain was worsened with shoulder movement.   Positive straight leg raise on left side   Patient moving all 4 extremities without difficulty.  Skin:    General: Skin is warm and dry.  Neurological:      Mental Status: He is alert.  Psychiatric:        Mood and Affect: Mood normal.     ED Results / Procedures / Treatments   Labs (all labs ordered are listed, but only abnormal results are displayed) Labs Reviewed  BASIC METABOLIC PANEL - Abnormal; Notable for the following components:      Result Value   Sodium 134 (*)    CO2 21 (*)    Glucose, Bld 110 (*)    All other components within normal limits  URINALYSIS, ROUTINE W REFLEX MICROSCOPIC - Abnormal; Notable for the following components:   Leukocytes,Ua TRACE (*)    All other components within normal limits  CBC WITH DIFFERENTIAL/PLATELET  TROPONIN I (HIGH SENSITIVITY)  TROPONIN I (HIGH SENSITIVITY)    EKG None  Radiology DG Chest Port 1 View  Result Date: 06/01/2020 CLINICAL DATA:  Mid back pain for 2 weeks EXAM: PORTABLE CHEST 1 VIEW COMPARISON:  06/22/2019 FINDINGS: Normal heart size and mediastinal contours. No acute infiltrate or edema. No effusion or pneumothorax. No acute osseous findings. IMPRESSION: Negative chest. Electronically Signed   By: Marnee Spring M.D.   On: 06/01/2020 10:15    Procedures Procedures   Medications Ordered in ED Medications  morphine 4 MG/ML injection 4 mg (4 mg Intravenous Given 06/01/20 1012)  ondansetron (ZOFRAN) injection 4 mg (4 mg Intravenous Given 06/01/20 1011)  cyclobenzaprine (FLEXERIL) tablet 10 mg (10 mg Oral Given 06/01/20 1223)    ED Course  I have reviewed the triage vital signs and the nursing notes.  Pertinent labs & imaging results that were available during my care of the patient were reviewed by me and considered in my medical decision making (see chart for details).    MDM Rules/Calculators/A&P                         Initial impression-patient presents with chief complaint of left upper back pain and chest pain.  He is alert, does not appear in acute distress, vital signs reassuring.  Suspect patient suffering from a muscular strain but will order chest pain  work-up, as well as a UA for further evaluation.  Work-up-CBC is unremarkable, BMP shows slight hyponatremia 134, slight decrease in CO2 of 21, slight hyperglycemia 110, UA unremarkable, first troponin was 4, second troponin was 3.  Chest x-ray unremarkable, EKG sinus rhythm not signs of ischemia no ST elevation depression noted.  Rule out- I have low suspicion for ACS as history is atypical, patient has no cardiac history, EKG was sinus rhythm without signs of ischemia, patient had a delta troponin.  Low suspicion for PE as patient denies pleuritic chest pain, shortness of breath, patient denies leg pain, no pedal edema noted on exam, patient was PERC negative.  Low suspicion for AAA or aortic dissection as history is atypical, patient has low risk factors.  Low suspicion for kidney stone or UTI as UA is negative for signs infection.  low suspicion for pneumonia as lung sounds are clear bilaterally, x-ray is unremarkable.  Low suspicion for systemic infection as patient is nontoxic-appearing, vital signs reassuring, no obvious source infection noted on exam.   Plan-I suspect patient's pain is muscular, will start patient on a steroid, not relaxers, recommend over-the-counter pain medication follow-up with PCP for further evaluation.  Vital signs have remained stable, no indication for hospital admission. Patient given at home care as well strict return precautions.  Patient verbalized that they understood agreed to said plan.   Final Clinical Impression(s) / ED Diagnoses Final diagnoses:  Acute left-sided thoracic back pain    Rx / DC Orders ED Discharge Orders         Ordered    cyclobenzaprine (FLEXERIL) 10 MG tablet  2 times daily PRN        06/01/20 1237    predniSONE (DELTASONE) 20 MG tablet  Daily        06/01/20 1237           Barnie Del 06/01/20 1306    Benjiman Core, MD 06/01/20 [phone]

## 2020-06-01 NOTE — ED Triage Notes (Signed)
Pt here for mid back pain , no trauma noted , been hurting for 2 weeks ,

## 2020-06-01 NOTE — ED Notes (Signed)
Pt walked well to exam room. Pt denies numbness and tingling. Pt denies loss of bowel or bladder.

## 2020-06-01 NOTE — Discharge Instructions (Addendum)
You have been seen here for back pain.  I gave you a prescription for Flexeril this is a muscle relaxer can make you drowsy do not consume alcohol or operate heavy machinery while taking this medication.  start you on steroids please take as prescribed.  Please beware this medication make you more jittery, increase your blood pressure, increase your heart rate this will subside 5 days after your last dose.  I recommend taking over-the-counter pain medications like ibuprofen and/or Tylenol every 6 as needed.  Please follow dosage and on the back of bottle.  I also recommend applying heat to the area and stretching out the muscles as this will help decrease stiffness and pain.  I have given you information on exercises please follow.  I like you to follow-up with community health and wellness they can help you find a primary care provider please call to schedule a follow-up appointment.  Come back to the emergency department if you develop chest pain, shortness of breath, severe abdominal pain, uncontrolled nausea, vomiting, diarrhea.

## 2020-06-01 NOTE — ED Notes (Signed)
When I went into pt's room he was fully dressed and told me his VS were taken already. After the pt left and I looked at his chart, I realized the temp was not taken, but pt was gone already.

## 2021-12-08 IMAGING — CR DG CHEST 2V
2 series · 2 of 2 positions shown · non-contrast
Comparison: 06/02/2019

CLINICAL DATA: Abdominal pain

EXAM:
CHEST - 2 VIEW

[chest pa]
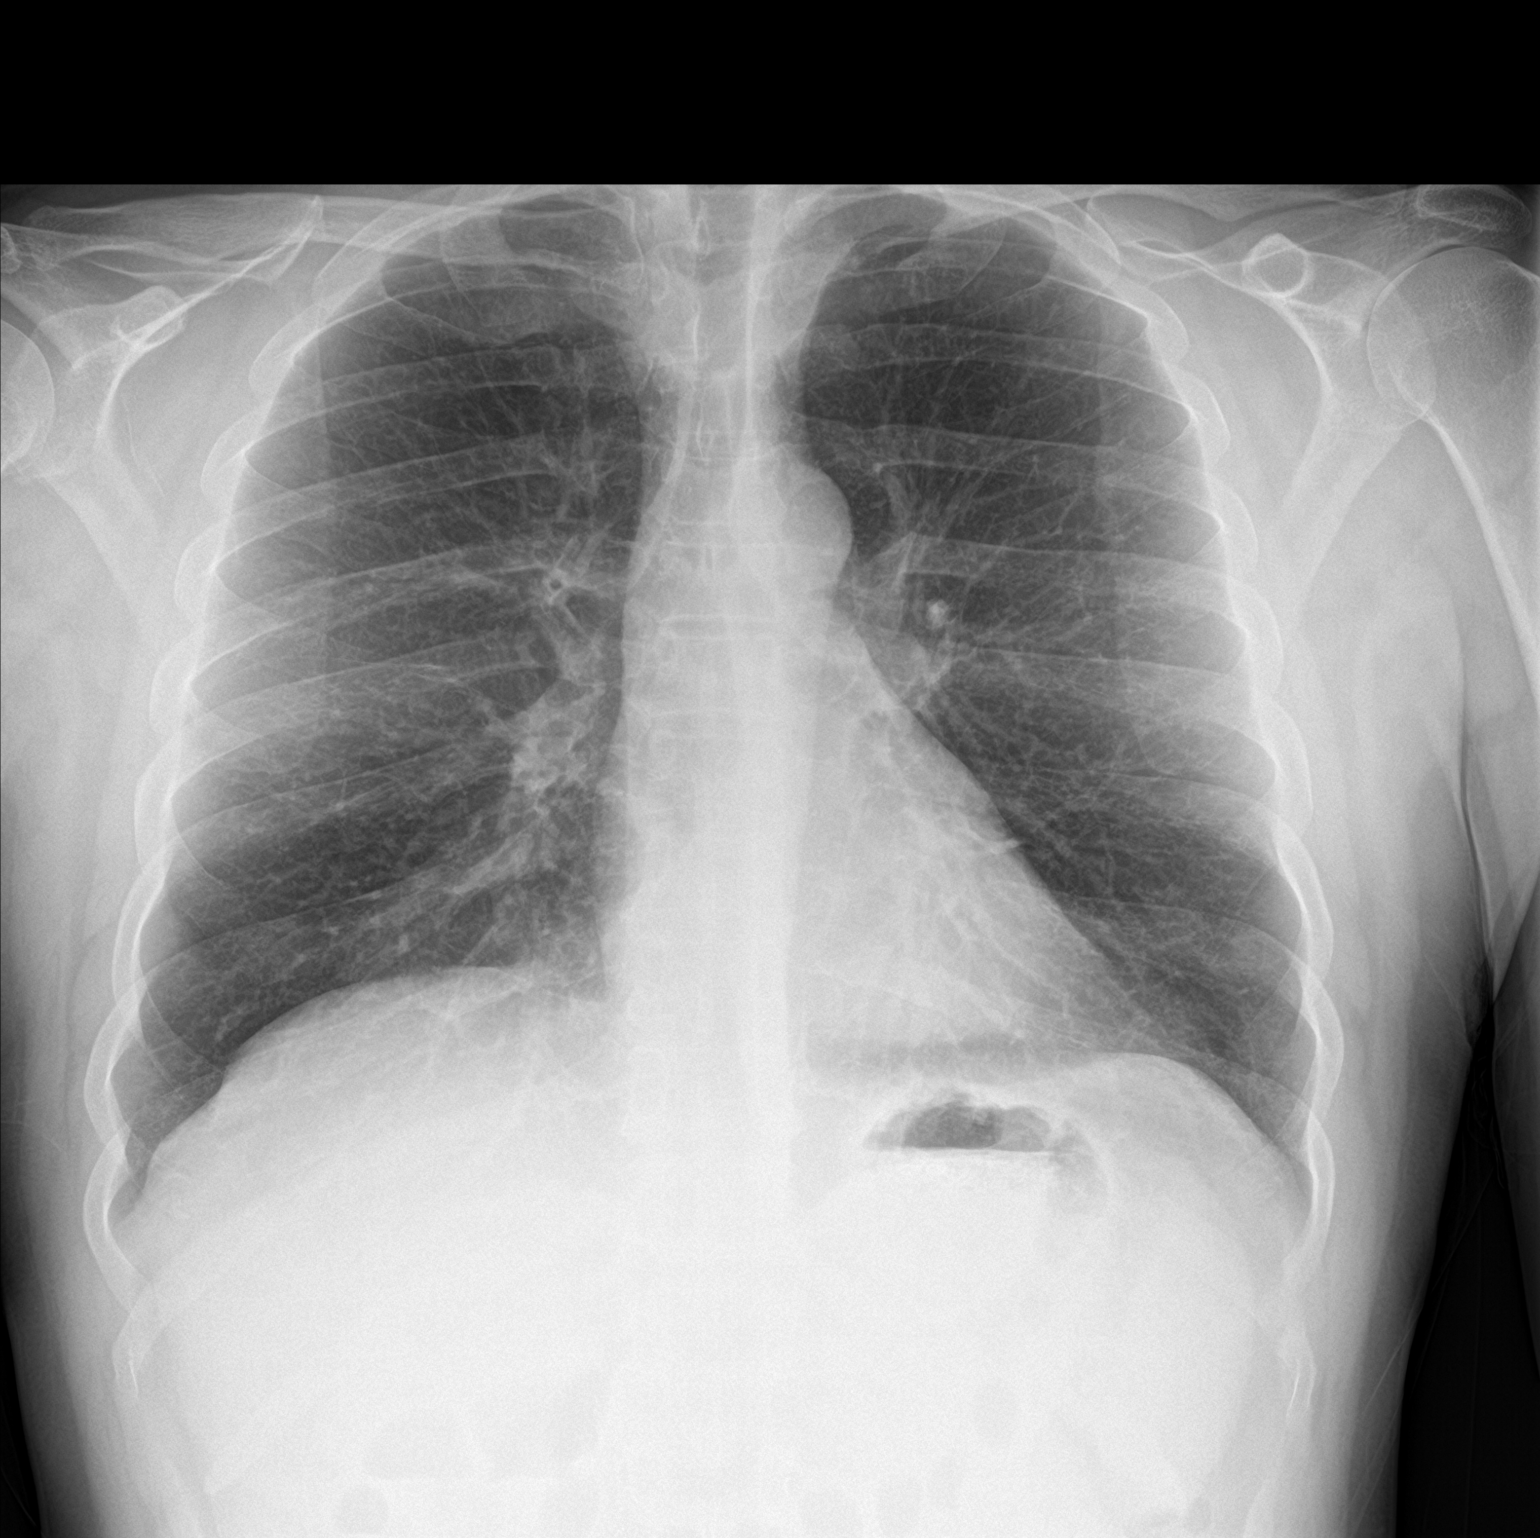

[chest lat]
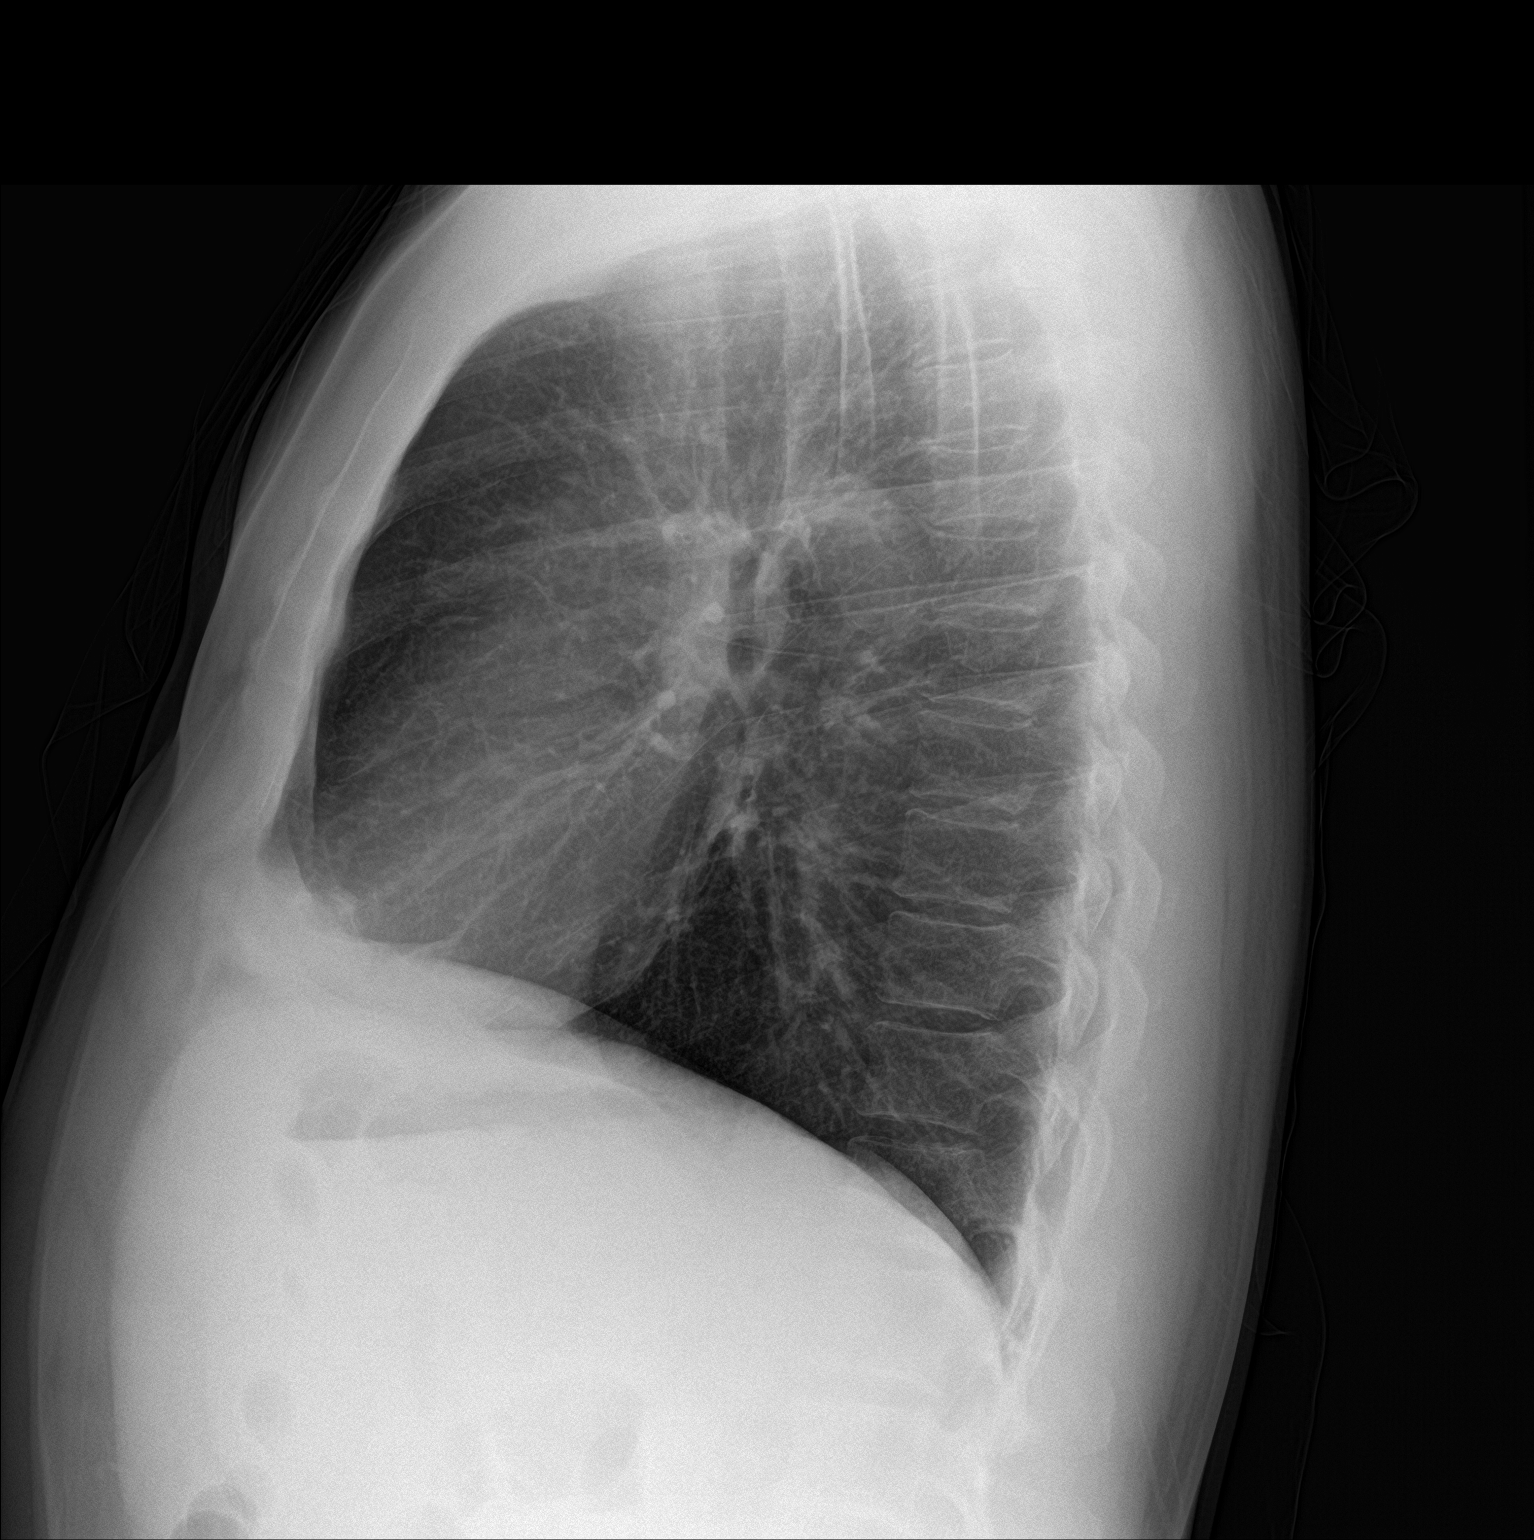

[2 of 2 positions shown; findings below may reference images not displayed]

FINDINGS: The heart size and mediastinal contours are within normal limits. No
focal airspace consolidation, pleural effusion, or pneumothorax. The
visualized skeletal structures are unremarkable.
IMPRESSION: No active cardiopulmonary disease.

## 2022-05-14 ENCOUNTER — Ambulatory Visit: Payer: Medicaid Other | Admitting: Gastroenterology

## 2022-05-14 ENCOUNTER — Encounter: Payer: Self-pay | Admitting: Gastroenterology

## 2022-05-14 VITALS — BP 134/82 | HR 79 | Ht 67.0 in | Wt 180.0 lb

## 2022-05-14 DIAGNOSIS — Z01818 Encounter for other preprocedural examination: Secondary | ICD-10-CM

## 2022-05-14 DIAGNOSIS — K640 First degree hemorrhoids: Secondary | ICD-10-CM

## 2022-05-14 DIAGNOSIS — Z1211 Encounter for screening for malignant neoplasm of colon: Secondary | ICD-10-CM | POA: Diagnosis not present

## 2022-05-14 MED ORDER — NA SULFATE-K SULFATE-MG SULF 17.5-3.13-1.6 GM/177ML PO SOLN: 1.0000 | Freq: Once | ORAL | 0 refills | Status: AC

## 2022-05-14 NOTE — Progress Notes (Signed)
HPI : Edward Kelley is a very pleasant 52 year old male with a history of arthritis and chronic tobacco use who is referred to [place] by Dr. Leota Sauers for initial screening colonoscopy.  The patient has no family history of colon cancer.  He has never had any prior colon cancer screening. He reports that he has always had a 'funky tummy' and had issues with abdominal pain in his 3s, but has not had problems with pain recently.  He does have symptoms that are most likely hemorrhoidal in nature, manifested as recurrent bright red blood per rectum, sometimes with mucus.  This was first noted over 20 years ago, but has been fairly regular over the past year.  He typically sees blood once every 2-3 weeks. He has regular bowel movements, typically 1-3 per day.  Stool is typically formed and solid.  Straining is not typically an issue for him.  He describes having to manually reduce hemorrhoids on occasion. No weight loss, rather his weight has been increasing.  He has occasional GERD symptoms when he eats the wrong thing, but typically symptoms are not bad enough to warrant taking any medication.  He has been smoking since age 62, typically smokes about a pack per day.  He has been diagnosed with asthma and he has a chronic cough.  He is bothered by severe arthritis of the hip which affects his mobility (uses a cane for ambulation)  Past Medical History:  Diagnosis Date   Anxiety    Arthritis    "neck & shoulders" (10/12/2015)   Asthma    Bursitis    "neck & shoulders"   Chronic back pain    "all over" (10/12/2015)   Dental disease    advanced/notes 10/12/2015   GERD (gastroesophageal reflux disease)    Headache    "weekly" (10/12/2015)   Kidney stone    "currently" (10/12/2015)   Migraine    "weekly" (10/12/2015)     Past Surgical History:  Procedure Laterality Date   HERNIA REPAIR     LAPAROSCOPIC APPENDECTOMY N/A 10/12/2015   Procedure: APPENDECTOMY LAPAROSCOPIC;  Surgeon: Greer Pickerel, MD;   Location: [facility] OR;  Service: General;  Laterality: N/A;   WISDOM TOOTH EXTRACTION     Family History  Problem Relation Age of Onset   Breast cancer Mother    Bladder Cancer Father    Heart disease Maternal Grandmother    Heart disease Maternal Grandfather    Colon cancer Neg Hx    Esophageal cancer Neg Hx    Stomach cancer Neg Hx    Social History   Tobacco Use   Smoking status: Never   Smokeless tobacco: Never  Vaping Use   Vaping Use: Never used  Substance Use Topics   Alcohol use: Yes    Comment: 10/12/2015 "1/2 gallon vodka twice/month"   Drug use: Yes    Types: Marijuana   Current Outpatient Medications  Medication Sig Dispense Refill   ALPRAZolam (XANAX) 0.25 MG tablet Take by mouth.     gabapentin (NEURONTIN) 100 MG capsule Take 100 mg by mouth 3 (three) times daily.     meloxicam (MOBIC) 7.5 MG tablet Take 7.5 mg by mouth daily.     acetaminophen (TYLENOL) 500 MG tablet Take 500 mg by mouth daily as needed for headache. (Patient not taking: Reported on 05/14/2022)     albuterol (VENTOLIN HFA) 108 (90 Base) MCG/ACT inhaler Inhale 2 puffs into the lungs every 6 (six) hours as needed for wheezing. (  Patient not taking: Reported on 05/14/2022) 8 g 0   cyclobenzaprine (FLEXERIL) 10 MG tablet Take 1 tablet (10 mg total) by mouth 2 (two) times daily as needed for muscle spasms. (Patient not taking: Reported on 05/14/2022) 20 tablet 0   Multiple Vitamins-Minerals (EMERGEN-C BLUE) PACK Take 1 Package by mouth daily. (Patient not taking: Reported on 05/14/2022)     ondansetron (ZOFRAN) 4 MG tablet Take 1 tablet (4 mg total) by mouth every 8 (eight) hours as needed for nausea or vomiting. (Patient not taking: Reported on 05/14/2022) 12 tablet 0   Propylene Glycol (SYSTANE BALANCE OP) Place 1 drop into both eyes daily as needed (for dry eyes). (Patient not taking: Reported on 05/14/2022)     No current facility-administered medications for this visit.   Allergies  Allergen Reactions   Codeine  Hives and Itching     Review of Systems: All systems reviewed and negative except where noted in HPI.    No results found.  Physical Exam: BP 134/82   Pulse 79   Ht '5\' 7"'$  (1.702 m)   Wt 180 lb (81.6 kg)   BMI 28.19 kg/m  Constitutional: Pleasant,well-developed, Caucasian male in no acute distress. HEENT: Normocephalic and atraumatic. Conjunctivae are normal. No scleral icterus. Neck supple.  Cardiovascular: Normal rate, regular rhythm.  Pulmonary/chest: Effort normal and breath sounds normal. No wheezing, rales or rhonchi. Abdominal: Soft, nondistended, nontender. Bowel sounds active throughout. There are no masses palpable. No hepatomegaly. Extremities: no edema Neurological: Alert and oriented to person place and time.  Antalgia gait due to hip pain, needs some light assistance getting onto exam table. Skin: Skin is warm and dry. No rashes noted. Psychiatric: Normal mood and affect. Behavior is normal.  CBC    Component Value Date/Time   WBC 6.9 06/01/2020 0927   RBC 5.36 06/01/2020 0927   HGB 15.8 06/01/2020 0927   HCT 46.6 06/01/2020 0927   PLT 239 06/01/2020 0927   MCV 86.9 06/01/2020 0927   MCH 29.5 06/01/2020 0927   MCHC 33.9 06/01/2020 0927   RDW 13.0 06/01/2020 0927   LYMPHSABS 2.0 06/01/2020 0927   MONOABS 0.6 06/01/2020 0927   EOSABS 0.4 06/01/2020 0927   BASOSABS 0.1 06/01/2020 0927    CMP     Component Value Date/Time   NA 134 (L) 06/01/2020 0927   K 4.2 06/01/2020 0927   CL 104 06/01/2020 0927   CO2 21 (L) 06/01/2020 0927   GLUCOSE 110 (H) 06/01/2020 0927   BUN 11 06/01/2020 0927   CREATININE 0.97 06/01/2020 0927   CALCIUM 9.2 06/01/2020 0927   PROT 7.3 06/22/2019 1440   ALBUMIN 3.7 06/22/2019 1440   AST 41 06/22/2019 1440   ALT 68 (H) 06/22/2019 1440   ALKPHOS 51 06/22/2019 1440   BILITOT 0.6 06/22/2019 1440   GFRNONAA >60 06/01/2020 0927   GFRAA >60 06/22/2019 1440     ASSESSMENT AND PLAN: 52 year old male with history of chronic  tobacco use and arthritis due for initial screening colonoscopy.  He has long standing symptoms consistent with hemorrhoidal bleeding and a remote history of IBS-like symptoms, but currently has regular bowel movements and no issues with abdominal pain.  No family history of colon cancer. Will schedule for initial average risk screening colonoscopy.  Colon cancer screening - Colonoscopy  Grade I hemorrhoids  The details, risks (including bleeding, perforation, infection, missed lesions, medication reactions and possible hospitalization or surgery if complications occur), benefits, and alternatives to colonoscopy with possible biopsy  and possible polypectomy were discussed with the patient and he consents to proceed.   Shynice Sigel E. Candis Schatz, MD [facility]  CC:  Donnamarie Poag Bebe Shaggy, MD

## 2022-05-14 NOTE — Patient Instructions (Signed)
_______________________________________________________  If your blood pressure at your visit was 140/90 or greater, please contact your primary care physician to follow up on this.  _______________________________________________________  If you are age 52 or older, your body mass index should be between 23-30. Your Body mass index is 28.19 kg/m. If this is out of the aforementioned range listed, please consider follow up with your Primary Care Provider.  If you are age 73 or younger, your body mass index should be between 19-25. Your Body mass index is 28.19 kg/m. If this is out of the aformentioned range listed, please consider follow up with your Primary Care Provider.   You have been scheduled for a colonoscopy. Please follow written instructions given to you at your visit today.  Please pick up your prep supplies at the pharmacy within the next 1-3 days. If you use inhalers (even only as needed), please bring them with you on the day of your procedure.   The [facility] providers would like to encourage you to use [facility] to communicate with providers for non-urgent requests or questions.  Due to long hold times on the telephone, sending your provider a message by [facility] may be a faster and more efficient way to get a response.  Please allow 48 business hours for a response.  Please remember that this is for non-urgent requests.   It was a pleasure to see you today!  Thank you for trusting me with your gastrointestinal care!    Scott E.Candis Schatz, MD

## 2022-05-29 ENCOUNTER — Ambulatory Visit (AMBULATORY_SURGERY_CENTER): Payer: Medicaid Other | Admitting: Gastroenterology

## 2022-05-29 ENCOUNTER — Encounter: Payer: Self-pay | Admitting: Gastroenterology

## 2022-05-29 VITALS — BP 126/100 | HR 83 | Temp 96.4°F | Resp 13 | Ht 67.0 in | Wt 180.0 lb

## 2022-05-29 DIAGNOSIS — K635 Polyp of colon: Secondary | ICD-10-CM

## 2022-05-29 DIAGNOSIS — D122 Benign neoplasm of ascending colon: Secondary | ICD-10-CM

## 2022-05-29 DIAGNOSIS — D127 Benign neoplasm of rectosigmoid junction: Secondary | ICD-10-CM

## 2022-05-29 DIAGNOSIS — Z1211 Encounter for screening for malignant neoplasm of colon: Secondary | ICD-10-CM | POA: Diagnosis not present

## 2022-05-29 DIAGNOSIS — D128 Benign neoplasm of rectum: Secondary | ICD-10-CM

## 2022-05-29 DIAGNOSIS — D123 Benign neoplasm of transverse colon: Secondary | ICD-10-CM

## 2022-05-29 MED ORDER — SODIUM CHLORIDE 0.9 % IV SOLN: 500.0000 mL | Freq: Once | INTRAVENOUS | Status: DC

## 2022-05-29 NOTE — Progress Notes (Signed)
Report to PACU, RN, vss, BBS= Clear.  

## 2022-05-29 NOTE — Patient Instructions (Signed)
Please read handouts provided. Continue present medications. Await pathology results.   YOU HAD AN ENDOSCOPIC PROCEDURE TODAY AT THE [facility]:   Refer to the procedure report that was given to you for any specific questions about what was found during the examination.  If the procedure report does not answer your questions, please call your gastroenterologist to clarify.  If you requested that your care partner not be given the details of your procedure findings, then the procedure report has been included in a sealed envelope for you to review at your convenience later.  YOU SHOULD EXPECT: Some feelings of bloating in the abdomen. Passage of more gas than usual.  Walking can help get rid of the air that was put into your GI tract during the procedure and reduce the bloating. If you had a lower endoscopy (such as a colonoscopy or flexible sigmoidoscopy) you may notice spotting of blood in your stool or on the toilet paper. If you underwent a bowel prep for your procedure, you may not have a normal bowel movement for a few days.  Please Note:  You might notice some irritation and congestion in your nose or some drainage.  This is from the oxygen used during your procedure.  There is no need for concern and it should clear up in a day or so.  SYMPTOMS TO REPORT IMMEDIATELY:  Following lower endoscopy (colonoscopy or flexible sigmoidoscopy):  Excessive amounts of blood in the stool  Significant tenderness or worsening of abdominal pains  Swelling of the abdomen that is new, acute  Fever of 100F or higher  For urgent or emergent issues, a gastroenterologist can be reached at any hour by calling [phone]. Do not use MyChart messaging for urgent concerns.    DIET:  We do recommend a small meal at first, but then you may proceed to your regular diet.  Drink plenty of fluids but you should avoid alcoholic beverages for 24 hours.  ACTIVITY:  You should plan to take it easy for  the rest of today and you should NOT DRIVE or use heavy machinery until tomorrow (because of the sedation medicines used during the test).    FOLLOW UP: Our staff will call the number listed on your records the next business day following your procedure.  We will call around 7:15- 8:00 am to check on you and address any questions or concerns that you may have regarding the information given to you following your procedure. If we do not reach you, we will leave a message.     If any biopsies were taken you will be contacted by phone or by letter within the next 1-3 weeks.  Please call us at [phone] if you have not heard about the biopsies in 3 weeks.    SIGNATURES/CONFIDENTIALITY: You and/or your care partner have signed paperwork which will be entered into your electronic medical record.  These signatures attest to the fact that that the information above on your After Visit Summary has been reviewed and is understood.  Full responsibility of the confidentiality of this discharge information lies with you and/or your care-partner. 

## 2022-05-29 NOTE — Op Note (Addendum)
Edward Kelley Patient Name: Edward Kelley Procedure Date: 05/29/2022 8:05 AM MRN: [id number] Endoscopist: Nicki Reaper E. Candis Schatz , MD, [id number] Age: 52 Referring MD:  Date of Birth: February 27, 1971 Gender: Male Account #: [id number] Procedure:                Colonoscopy Indications:              Screening for colorectal malignant neoplasm, This                            is the patient's first colonoscopy Medicines:                Monitored Anesthesia Care Procedure:                Pre-Anesthesia Assessment:                           - Prior to the procedure, a History and Physical                            was performed, and patient medications and                            allergies were reviewed. The patient's tolerance of                            previous anesthesia was also reviewed. The risks                            and benefits of the procedure and the sedation                            options and risks were discussed with the patient.                            All questions were answered, and informed consent                            was obtained. Prior Anticoagulants: The patient has                            taken no anticoagulant or antiplatelet agents. ASA                            Grade Assessment: II - A patient with mild systemic                            disease. After reviewing the risks and benefits,                            the patient was deemed in satisfactory condition to                            undergo the procedure.  After obtaining informed consent, the colonoscope                            was passed under direct vision. Throughout the                            procedure, the patient's blood pressure, pulse, and                            oxygen saturations were monitored continuously. The                            Olympus CF-HQ190L [id number]) Colonoscope was                            introduced through the  anus and advanced to the the                            cecum, identified by appendiceal orifice and                            ileocecal valve. The colonoscopy was performed                            without difficulty. The patient tolerated the                            procedure well. The quality of the bowel                            preparation was good. The ileocecal valve,                            appendiceal orifice, and rectum were photographed.                            The bowel preparation used was SUPREP via split                            dose instruction. Scope In: 8:13:20 AM Scope Out: 8:38:55 AM Scope Withdrawal Time: 0 hours 23 minutes 20 seconds  Total Procedure Duration: 0 hours 25 minutes 35 seconds  Findings:                 External hemorrhoids and prolpased internal                            hemorrhoids were found on perianal exam.                           The perianal and digital rectal examinations were                            otherwise normal. Pertinent negatives include  normal sphincter tone and no palpable rectal                            lesions.                           A 5 mm polyp was found in the ascending colon. The                            polyp was sessile. The polyp was removed with a                            cold snare. Resection and retrieval were complete.                            Estimated blood loss was minimal.                           Two sessile polyps were found in the hepatic                            flexure. The polyps were 3 to 5 mm in size. These                            polyps were removed with a cold snare. Resection                            and retrieval were complete. Estimated blood loss                            was minimal.                           A 10 mm polyp was found in the transverse colon.                            The polyp was flat. The polyp was removed with a                             cold snare. Resection and retrieval were complete.                            Estimated blood loss was minimal.                           A 3 mm polyp was found in the splenic flexure. The                            polyp was sessile. The polyp was removed with a                            cold snare. Resection and retrieval were complete.  Estimated blood loss was minimal.                           A 10 mm polyp was found in the recto-sigmoid colon.                            The polyp was pedunculated. The polyp was removed                            with a cold snare. Resection and retrieval were                            complete. Estimated blood loss was minimal.                           A 7 mm polyp was found in the proximal rectum. The                            polyp was sessile. The polyp was removed with a                            cold snare. Resection and retrieval were complete.                            Estimated blood loss was minimal.                           A 4 mm polyp was found in the distal rectum. The                            polyp was sessile. The polyp was removed with a                            cold snare. Resection and retrieval were complete.                            Estimated blood loss was minimal.                           A single medium-mouthed diverticulum was found in                            the ascending colon.                           The exam was otherwise normal throughout the                            examined colon.                           Non-bleeding internal hemorrhoids were found during  retroflexion. The hemorrhoids were large and Grade                            II (internal hemorrhoids that prolapse but reduce                            spontaneously).                           No additional abnormalities were found on                             retroflexion. Complications:            No immediate complications. Estimated Blood Loss:     Estimated blood loss was minimal. Impression:               - Hemorrhoids found on perianal exam.                           - One 5 mm polyp in the ascending colon, removed                            with a cold snare. Resected and retrieved.                           - Two 3 to 5 mm polyps at the hepatic flexure,                            removed with a cold snare. Resected and retrieved.                           - One 10 mm polyp in the transverse colon, removed                            with a cold snare. Resected and retrieved.                           - One 3 mm polyp at the splenic flexure, removed                            with a cold snare. Resected and retrieved.                           - One 10 mm polyp at the recto-sigmoid colon,                            removed with a cold snare. Resected and retrieved.                           - One 7 mm polyp in the proximal rectum, removed                            with a cold snare. Resected and retrieved.                           -  One 4 mm polyp in the distal rectum, removed with                            a cold snare. Resected and retrieved.                           - Diverticulosis in the ascending colon.                           - Non-bleeding internal hemorrhoids.                           - The GI Genius (intelligent endoscopy module),                            computer-aided polyp detection system powered by AI                            was utilized to detect colorectal polyps through                            enhanced visualization during colonoscopy. Recommendation:           - Patient has a contact number available for                            emergencies. The signs and symptoms of potential                            delayed complications were discussed with the                            patient. Return to normal  activities tomorrow.                            Written discharge instructions were provided to the                            patient.                           - Resume previous diet.                           - Continue present medications.                           - Await pathology results.                           - Repeat colonoscopy (date not yet determined) for                            surveillance based on pathology results. Helen Winterhalter E. Candis Schatz, MD 05/29/2022 8:58:44 AM This report has been signed electronically.

## 2022-05-29 NOTE — Progress Notes (Signed)
Called to room to assist during endoscopic procedure.  Patient ID and intended procedure confirmed with present staff. Received instructions for my participation in the procedure from the performing physician.  

## 2022-05-29 NOTE — Progress Notes (Signed)
History and Physical Interval Note:  05/29/2022 8:07 AM  Edward Kelley.  has presented today for endoscopic procedure(s), with the diagnosis of  Encounter Diagnosis  Name Primary?   Screening for colon cancer Yes  .  The various methods of evaluation and treatment have been discussed with the patient and/or family. After consideration of risks, benefits and other options for treatment, the patient has consented to  the endoscopic procedure(s).   The patient's history has been reviewed, patient examined, no change in status, stable for endoscopic procedure(s).  I have reviewed the patient's chart and labs.  Questions were answered to the patient's satisfaction.     Camey Edell E. Candis Schatz, MD [facility]

## 2022-05-30 ENCOUNTER — Telehealth: Payer: Self-pay | Admitting: *Deleted

## 2022-05-30 NOTE — Telephone Encounter (Signed)
No answer on  follow up call. Left message.   

## 2022-06-11 NOTE — Progress Notes (Signed)
Edward Kelley,  Several polyps that I removed during your recent procedure were completely benign but were proven to be "pre-cancerous" polyps that MAY have grown into cancers if they had not been removed.  Studies shows that at least 20% of women over age 52 and 30% of men over age 50 have pre-cancerous polyps.  Based on current nationally recognized surveillance guidelines, I recommend that you have a repeat colonoscopy in 3 years.   If you develop any new rectal bleeding, abdominal pain or significant bowel habit changes, please contact me before then.

## 2022-08-28 ENCOUNTER — Other Ambulatory Visit (HOSPITAL_COMMUNITY): Payer: Self-pay | Admitting: Urology

## 2022-08-28 DIAGNOSIS — C61 Malignant neoplasm of prostate: Secondary | ICD-10-CM

## 2022-09-13 ENCOUNTER — Encounter (HOSPITAL_COMMUNITY)
Admission: RE | Admit: 2022-09-13 | Discharge: 2022-09-13 | Disposition: A | Discharge status: Home / Self Care | Payer: Medicaid Other | Source: Ambulatory Visit | Attending: Urology | Admitting: Urology

## 2022-09-13 DIAGNOSIS — C61 Malignant neoplasm of prostate: Secondary | ICD-10-CM | POA: Diagnosis present

## 2022-09-13 MED ORDER — PIFLIFOLASTAT F 18 (PYLARIFY) INJECTION
9.0000 | Freq: Once | INTRAVENOUS | Status: AC
  Administered 2022-09-13: 9.26 via INTRAVENOUS

## 2022-09-30 NOTE — H&P (Signed)
TOTAL HIP ADMISSION H&P  Patient is admitted for left total hip arthroplasty.  HEP Active prostate cancer - undergoing prostatectomy soon -- Xarelto  Subjective:  Chief Complaint: left hip pain  HPI: Edward Kelley., 52 y.o. male, has a history of pain and functional disability in the left hip(s) due to  avascular necrosis  and patient has failed non-surgical conservative treatments for greater than 12 weeks to include NSAID's and/or analgesics, use of assistive devices, and activity modification.  Onset of symptoms was abrupt starting 1 years ago with rapidlly worsening course since that time.The patient noted no past surgery on the left hip(s).  Patient currently rates pain in the left hip at 8 out of 10 with activity. Patient has worsening of pain with activity and weight bearing, pain that interfers with activities of daily living, and pain with passive range of motion. Patient has evidence of  avascular necrosis  by imaging studies. This condition presents safety issues increasing the risk of falls. There is no current active infection.  Patient Active Problem List   Diagnosis Date Noted   Asthma exacerbation 06/02/2019   Acute appendicitis 10/12/2015   Past Medical History:  Diagnosis Date   Anxiety    Arthritis    "neck & shoulders" (10/12/2015)   Asthma    Bursitis    "neck & shoulders"   Chronic back pain    "all over" (10/12/2015)   Dental disease    advanced/notes 10/12/2015   GERD (gastroesophageal reflux disease)    Headache    "weekly" (10/12/2015)   Kidney stone    "currently" (10/12/2015)   Migraine    "weekly" (10/12/2015)    Past Surgical History:  Procedure Laterality Date   HERNIA REPAIR     LAPAROSCOPIC APPENDECTOMY N/A 10/12/2015   Procedure: APPENDECTOMY LAPAROSCOPIC;  Surgeon: Gaynelle Adu, MD;  Location: [facility] OR;  Service: General;  Laterality: N/A;   WISDOM TOOTH EXTRACTION      No current facility-administered medications for this encounter.   Current  Outpatient Medications  Medication Sig Dispense Refill Last Dose   acetaminophen (TYLENOL) 500 MG tablet Take 500 mg by mouth daily as needed for headache.      ALPRAZolam (XANAX) 0.25 MG tablet Take by mouth.      gabapentin (NEURONTIN) 100 MG capsule Take 100 mg by mouth 3 (three) times daily.      HYDROcodone-acetaminophen (NORCO/VICODIN) 5-325 MG tablet Take 1 tablet by mouth every 6 (six) hours as needed.      meloxicam (MOBIC) 7.5 MG tablet Take 7.5 mg by mouth daily.      Multiple Vitamins-Minerals (EMERGEN-C BLUE) PACK Take 1 Package by mouth daily. (Patient not taking: Reported on 05/14/2022)      No Known Allergies  Social History   Tobacco Use   Smoking status: Every Day    Types: Cigarettes   Smokeless tobacco: Never  Substance Use Topics   Alcohol use: Yes    Comment: 10/12/2015 "1/2 gallon vodka twice/month"    Family History  Problem Relation Age of Onset   Breast cancer Mother    Bladder Cancer Father    Heart disease Maternal Grandmother    Heart disease Maternal Grandfather    Colon cancer Neg Hx    Esophageal cancer Neg Hx    Stomach cancer Neg Hx    Rectal cancer Neg Hx      Review of Systems  Constitutional:  Negative for chills and fever.  Respiratory:  Negative for cough and  shortness of breath.   Cardiovascular:  Negative for chest pain.  Gastrointestinal:  Negative for nausea and vomiting.  Musculoskeletal:  Positive for arthralgias.     Objective:  Physical Exam Left hip exam: He has a painful and limited range of motion left hip with hip flexion internal rotation to 10 degrees, external rotation close to the 30 degrees with reproducible groin pain Mild tenderness around the left hip Passive range of motion of the right hip is fluid and pain-free He is neurovascular intact distally without significant lower extremity edema, erythema or calf tenderness   Vital signs in last 24 hours: BP: ()/()  Arterial Line BP: ()/()   Labs:   Estimated  body mass index is 28.19 kg/m as calculated from the following:   Height as of 05/29/22: 5\' 7"  (1.702 m).   Weight as of 05/29/22: 81.6 kg.   Imaging Review  Imaging: Today I reviewed plain radiographs of his left hip which reveal evidence of cystic changes within the femoral head consistent with avascular necrosis with evidence of subchondral collapse on both AP and lateral planes.  MRI of his left hip was reviewed as well from 06/19/2022. 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 He has also noted in addition to this to have moderate to advanced diffuse femoral head and acetabular cartilage thinning.     Assessment/Plan:  Avascular necrosis, left hip(s)  The patient history, physical examination, clinical judgement of the provider and imaging studies are consistent with avascular necrosis of the left hip(s) and total hip arthroplasty is deemed medically necessary. The treatment options including medical management, injection therapy, arthroscopy and arthroplasty were discussed at length. The risks and benefits of total hip arthroplasty were presented and reviewed. The risks due to aseptic loosening, infection, stiffness, dislocation/subluxation,  thromboembolic complications and other imponderables were discussed.  The patient acknowledged the explanation, agreed to proceed with the plan and consent was signed. Patient is being admitted for inpatient treatment for surgery, pain control, PT, OT, prophylactic antibiotics, VTE prophylaxis, progressive ambulation and ADL's and discharge planning.The patient is planning to be discharged  home.   Rosalene Billings, PA-C Orthopedic Surgery [facility] [place] [phone]

## 2022-10-03 NOTE — Progress Notes (Addendum)
COVID Vaccine received:  []  No [x]  Yes Date of any COVID positive Test in last 90 days:  None  PCP - Effie Shy, MD at [facility] [phone] Cardiologist - None  Chest x-ray - 06-01-2020  1v  Epic EKG - 06-02-2020 Epic   Stress Test -  ECHO -  Cardiac Cath -   PCR screen: [x]  Ordered & Completed           []   No Order but Needs PROFEND           []   N/A for this surgery  Surgery Plan:  []  Ambulatory                            [x]  Outpatient in bed                            []  Admit  Anesthesia:    []  General  [x]  Spinal                           []   Choice []   MAC  Pacemaker / ICD device [x]  No []  Yes   Spinal Cord Stimulator:[x]  No []  Yes       History of Sleep Apnea? [x]  No []  Yes   CPAP used?- [x]  No []  Yes    Does the patient monitor blood sugar?          [x]  No []  Yes  []  N/A  Patient has: []  NO Hx DM   [x]  Pre-DM                 []  DM1  []   DM2  Blood Thinner / Instructions: none Aspirin Instructions:   none  ERAS Protocol Ordered: []  No  [x]  Yes PRE-SURGERY []  ENSURE  [x]  G2  Patient is to be NPO after: 05:30  am  Comments: Patient was given the 5 CHG shower / bath instructions for THA surgery along with 2 bottles of the CHG soap. Patient will start this on:   Friday  10-04-2022   All questions were asked and answered, Patient voiced understanding of this process.   Activity level: Patient is  unable to climb a flight of stairs without difficulty; [x]  No CP  [x]  No SOB, but would have leg pain.   Patient can / can not perform ADLs without assistance.   Anesthesia review: Pre-DM (no Meds), HTN, asthma, anxiety-panic attacks, GERD, Avascular Necrosis, smoker  1ppd  Patient denies shortness of breath, fever, cough and chest pain at PAT appointment.  Patient verbalized understanding and agreement to the Pre-Surgical Instructions that were given to them at this PAT appointment. Patient was also educated of the need to review these PAT instructions again  prior to his surgery.I reviewed the appropriate phone numbers to call if they have any and questions or concerns.

## 2022-10-03 NOTE — Patient Instructions (Addendum)
SURGICAL WAITING ROOM VISITATION Patients having surgery or a procedure may have no more than 2 support people in the waiting area - these visitors may rotate in the visitor waiting room.   Due to an increase in RSV and influenza rates and associated hospitalizations, children ages 72 and under may not visit patients in [facility] hospitals. If the patient needs to stay at the hospital during part of their recovery, the visitor guidelines for inpatient rooms apply.  PRE-OP VISITATION  Pre-op nurse will coordinate an appropriate time for 1 support person to accompany the patient in pre-op.  This support person may not rotate.  This visitor will be contacted when the time is appropriate for the visitor to come back in the pre-op area.  Please refer to the [facility] website for the visitor guidelines for Inpatients (after your surgery is over and you are in a regular room).  You are not required to quarantine at this time prior to your surgery. However, you must do this: Hand Hygiene often Do NOT share personal items Notify your provider if you are in close contact with someone who has COVID or you develop fever 100.4 or greater, new onset of sneezing, cough, sore throat, shortness of breath or body aches.  If you test positive for Covid or have been in contact with anyone that has tested positive in the last 10 days please notify you surgeon.    Your procedure is scheduled on:  Tuesday  October 08, 2022  Report to [facility] Main Entrance: [place] entrance where the [employer] is available.   Report to admitting at:  06:00  AM  Call this number if you have any questions or problems the morning of surgery [phone]  Do not eat food after Midnight the night prior to your surgery/procedure.  After Midnight you may have the following liquids until   05:30 AM DAY OF SURGERY  Clear Liquid Diet Water Black Coffee (sugar ok, NO MILK/CREAM OR CREAMERS)  Tea (sugar ok, NO  MILK/CREAM OR CREAMERS) regular and decaf                             Plain Jell-O  with no fruit (NO RED)                                           Fruit ices (not with fruit pulp, NO RED)                                     Popsicles (NO RED)                                                                  Juice: NO CITRUS JUICES: only apple, WHITE grape, WHITE cranberry Sports drinks like Gatorade or Powerade (NO RED)                     The day of surgery:  Drink ONE (1) Pre-Surgery G2 at  05:30  AM the morning of  surgery. Drink in one sitting. Do not sip.  This drink was given to you during your hospital pre-op appointment visit. Nothing else to drink after completing the Pre-Surgery G2 : No candy, chewing gum or throat lozenges.    FOLLOW  ANY ADDITIONAL PRE OP INSTRUCTIONS YOU RECEIVED FROM YOUR SURGEON'S OFFICE!!!   Oral Hygiene is also important to reduce your risk of infection.        Remember - BRUSH YOUR TEETH THE MORNING OF SURGERY WITH YOUR REGULAR TOOTHPASTE  Do NOT smoke after Midnight the night before surgery.  STOP TAKING all Vitamins, Herbs and supplements 1 week before your surgery.   Take ONLY these medicines the morning of surgery with A SIP OF WATER: gabapentin,   you may take alprazolam (Xanax) if needed for anxiety. You may take either Tylenol or Hydrocodone if needed for pain.    You may not have any metal on your body including  jewelry, and body piercing  Do not wear  lotions, powders,  cologne, or deodorant  Men may shave face and neck.  Contacts, Hearing Aids, dentures or bridgework may not be worn into surgery. DENTURES WILL BE REMOVED PRIOR TO SURGERY PLEASE DO NOT APPLY "Poly grip" OR ADHESIVES!!!  You may bring a small overnight bag with you on the day of surgery, only pack items that are not valuable. [facility] IS NOT RESPONSIBLE   FOR VALUABLES THAT ARE LOST OR STOLEN.   Do not bring your home medications to the hospital. The Pharmacy will  dispense medications listed on your medication list to you during your admission in the Hospital.   Please read over the following fact sheets you were given: IF YOU HAVE QUESTIONS ABOUT YOUR PRE-OP INSTRUCTIONS, PLEASE CALL [phone].     Pre-operative 5 CHG Bath Instructions   You can play a key role in reducing the risk of infection after surgery. Your skin needs to be as free of germs as possible. You can reduce the number of germs on your skin by washing with CHG (chlorhexidine gluconate) soap before surgery. CHG is an antiseptic soap that kills germs and continues to kill germs even after washing.   DO NOT use if you have an allergy to chlorhexidine/CHG or antibacterial soaps. If your skin becomes reddened or irritated, stop using the CHG and notify one of our RNs at [phone]  Please shower with the CHG soap starting 4 days before surgery using the following schedule: START SHOWERS ON     FRIDAY  October 04, 2022  Please keep in mind the following:  DO NOT shave, including legs and underarms, starting the day of your first shower.   You may shave your face at any point before/day of surgery.   Place clean sheets on your bed the day you start using CHG soap. Use a clean washcloth (not used since being washed) for each shower. DO NOT sleep with pets once you start using the CHG.   CHG Shower Instructions:  If you choose to wash your hair and private area, wash first with your normal shampoo/soap.  After you use shampoo/soap, rinse your hair and body thoroughly to remove shampoo/soap residue.  Turn the water OFF and apply about 3 tablespoons (45 ml) of CHG soap to a CLEAN washcloth.  Apply CHG soap ONLY FROM YOUR NECK DOWN TO YOUR TOES (washing for 3-5 minutes)  DO NOT use CHG soap on face, private  areas, open wounds, or sores.  Pay special attention to the area where your surgery is being performed.  If you are having back surgery, having someone wash your back for you may be helpful.  Wait 2 minutes after CHG soap is applied, then you may rinse off the CHG soap.  Pat dry with a clean towel  Put on clean clothes/pajamas   If you choose to wear lotion, please use ONLY the CHG-compatible lotions on the back of this paper.     Additional instructions for the day of surgery: DO NOT APPLY any lotions, deodorants, cologne, or perfumes.   Put on clean/comfortable clothes.  Brush your teeth.  Ask your nurse before applying any prescription medications to the skin.      CHG Compatible Lotions   Aveeno Moisturizing lotion  Cetaphil Moisturizing Cream  Cetaphil Moisturizing Lotion  Clairol Herbal Essence Moisturizing Lotion, Dry Skin  Clairol Herbal Essence Moisturizing Lotion, Extra Dry Skin  Clairol Herbal Essence Moisturizing Lotion, Normal Skin  Curel Age Defying Therapeutic Moisturizing Lotion with Alpha Hydroxy  Curel Extreme Care Body Lotion  Curel Soothing Hands Moisturizing Hand Lotion  Curel Therapeutic Moisturizing Cream, Fragrance-Free  Curel Therapeutic Moisturizing Lotion, Fragrance-Free  Curel Therapeutic Moisturizing Lotion, Original Formula  Eucerin Daily Replenishing Lotion  Eucerin Dry Skin Therapy Plus Alpha Hydroxy Crme  Eucerin Dry Skin Therapy Plus Alpha Hydroxy Lotion  Eucerin Original Crme  Eucerin Original Lotion  Eucerin Plus Crme Eucerin Plus Lotion  Eucerin TriLipid Replenishing Lotion  Keri Anti-Bacterial Hand Lotion  Keri Deep Conditioning Original Lotion Dry Skin Formula Softly Scented  Keri Deep Conditioning Original Lotion, Fragrance Free Sensitive Skin Formula  Keri Lotion Fast Absorbing Fragrance Free Sensitive Skin Formula  Keri Lotion Fast Absorbing Softly Scented Dry Skin Formula  Keri Original Lotion  Keri Skin Renewal Lotion Keri  Silky Smooth Lotion  Keri Silky Smooth Sensitive Skin Lotion  Nivea Body Creamy Conditioning Oil  Nivea Body Extra Enriched Lotion  Nivea Body Original Lotion  Nivea Body Sheer Moisturizing Lotion Nivea Crme  Nivea Skin Firming Lotion  NutraDerm 30 Skin Lotion  NutraDerm Skin Lotion  NutraDerm Therapeutic Skin Cream  NutraDerm Therapeutic Skin Lotion  ProShield Protective Hand Cream  Provon moisturizing lotion   FAILURE TO FOLLOW THESE INSTRUCTIONS MAY RESULT IN THE CANCELLATION OF YOUR SURGERY  PATIENT SIGNATURE_________________________________  NURSE SIGNATURE__________________________________  ________________________________________________________________________        Edward Kelley    An incentive spirometer is a tool that can help keep your lungs clear and active. This tool measures how well you are filling your lungs with each breath.  Taking long deep breaths may help reverse or decrease the chance of developing breathing (pulmonary) problems (especially infection) following: A long period of time when you are unable to move or be active. BEFORE THE PROCEDURE  If the spirometer includes an indicator to show your best effort, your nurse or respiratory therapist will set it to a desired goal. If possible, sit up straight or lean slightly forward. Try not to slouch. Hold the incentive spirometer in an upright position. INSTRUCTIONS FOR USE  Sit on the edge of your bed if possible, or sit up as far as you can in bed or on a chair. Hold the incentive spirometer in an upright position. Breathe out normally. Place the mouthpiece in your mouth and seal your lips tightly around it. Breathe in slowly and as deeply as possible, raising the piston or the ball toward the top of the column. Hold your breath for 3-5 seconds or for as long as possible. Allow the piston or ball to fall to the bottom of the column. Remove the mouthpiece from your mouth and breathe out  normally. Rest for a few seconds and repeat Steps 1 through 7 at least 10 times every 1-2 hours when you are awake. Take your time and take a few normal breaths between deep breaths. The spirometer may include an indicator to show your best effort. Use the indicator as a goal to work toward during each repetition. After each set of 10 deep breaths, practice coughing to be sure your lungs are clear. If you have an incision (the cut made at the time of surgery), support your incision when coughing by placing a pillow or rolled up towels firmly against it. Once you are able to get out of bed, walk around indoors and cough well. You may stop using the incentive spirometer when instructed by your caregiver.  RISKS AND COMPLICATIONS Take your time so you do not get dizzy or light-headed. If you are in pain, you may need to take or ask for pain medication before doing incentive spirometry. It is harder to take a deep breath if you are having pain. AFTER USE Rest and breathe slowly and easily. It can be helpful to keep track of a log of your progress. Your caregiver can provide you with a simple table to help with this. If you are using the spirometer at home, follow these instructions: SEEK MEDICAL CARE IF:  You are having difficultly using the spirometer. You have trouble using the spirometer as often as instructed. Your pain medication is not giving enough relief while using the spirometer. You develop fever of 100.5 F (38.1 C) or higher.                                                                                                    SEEK IMMEDIATE MEDICAL CARE IF:  You cough up bloody sputum that had not been present before. You develop fever of 102 F (38.9 C) or greater. You develop worsening pain at or near the incision site. MAKE SURE YOU:  Understand these instructions. Will watch your condition.  Will get help right away if you are not doing well or get worse. Document Released:  07/08/2006 Document Revised: 05/20/2011 Document Reviewed: 09/08/2006 [facility] Patient Information 2014 [place], [place].      WHAT IS A BLOOD TRANSFUSION? Blood Transfusion Information  A transfusion is the replacement of blood or some of its parts. Blood is made up of multiple cells which provide different functions. Red blood cells carry oxygen and are used for blood loss replacement. White blood cells fight against infection. Platelets control bleeding. Plasma helps clot blood. Other blood products are available for specialized needs, such as hemophilia or other clotting disorders. BEFORE THE TRANSFUSION  Who gives blood for transfusions?  Healthy volunteers who are fully evaluated to make sure their blood is safe. This is blood bank blood. Transfusion therapy is the safest it has ever been in the practice of medicine. Before blood is taken from a donor, a complete history is taken to make sure that person has no history of diseases nor engages in risky social behavior (examples are intravenous drug use or sexual activity with multiple partners). The donor's travel history is screened to minimize risk of transmitting infections, such as malaria. The donated blood is tested for signs of infectious diseases, such as HIV and hepatitis. The blood is then tested to be sure it is compatible with you in order to minimize the chance of a transfusion reaction. If you or a relative donates blood, this is often done in anticipation of surgery and is not appropriate for emergency situations. It takes many days to process the donated blood. RISKS AND COMPLICATIONS Although transfusion therapy is very safe and saves many lives, the main dangers of transfusion include:  Getting an infectious disease. Developing a transfusion reaction. This is an allergic reaction to something in the blood you were given. Every precaution is taken to prevent this. The decision to have a blood transfusion has been  considered carefully by your caregiver before blood is given. Blood is not given unless the benefits outweigh the risks. AFTER THE TRANSFUSION Right after receiving a blood transfusion, you will usually feel much better and more energetic. This is especially true if your red blood cells have gotten low (anemic). The transfusion raises the level of the red blood cells which carry oxygen, and this usually causes an energy increase. The nurse administering the transfusion will monitor you carefully for complications. HOME CARE INSTRUCTIONS  No special instructions are needed after a transfusion. You may find your energy is better. Speak with your caregiver about any limitations on activity for underlying diseases you may have. SEEK MEDICAL CARE IF:  Your condition is not improving after your transfusion. You develop redness or irritation at the intravenous (IV) site. SEEK IMMEDIATE MEDICAL CARE IF:  Any of the following symptoms occur over the next 12 hours: Shaking chills. You have a temperature by mouth above 102 F (38.9 C), not controlled by medicine. Chest, back, or muscle pain. People around you feel you are not acting correctly or are confused. Shortness of breath or difficulty breathing. Dizziness and fainting. You get a rash or develop hives. You have a decrease in urine output. Your urine turns a dark color or changes to pink, red, or brown. Any of the following symptoms occur over the next 10 days: You have a temperature by mouth above 102 F (38.9 C), not controlled by medicine. Shortness of breath. Weakness after normal activity. The white part of the eye turns yellow (jaundice). You have a  decrease in the amount of urine or are urinating less often. Your urine turns a dark color or changes to pink, red, or brown. Document Released: 02/23/2000 Document Revised: 05/20/2011 Document Reviewed: 10/12/2007 [facility] Patient Information 2014 [place],  [place].  _______________________________________________________________________

## 2022-10-04 ENCOUNTER — Encounter (HOSPITAL_COMMUNITY)
Admission: RE | Admit: 2022-10-04 | Discharge: 2022-10-04 | Disposition: A | Discharge status: Home / Self Care | Payer: Medicaid Other | Source: Ambulatory Visit | Attending: Orthopedic Surgery | Admitting: Orthopedic Surgery

## 2022-10-04 ENCOUNTER — Other Ambulatory Visit: Payer: Self-pay

## 2022-10-04 ENCOUNTER — Encounter (HOSPITAL_COMMUNITY): Payer: Self-pay

## 2022-10-04 VITALS — BP 120/82 | HR 70 | Temp 98.1°F | Resp 22 | Ht 67.0 in | Wt 173.0 lb

## 2022-10-04 DIAGNOSIS — Z01818 Encounter for other preprocedural examination: Secondary | ICD-10-CM

## 2022-10-04 DIAGNOSIS — R9431 Abnormal electrocardiogram [ECG] [EKG]: Secondary | ICD-10-CM | POA: Insufficient documentation

## 2022-10-04 DIAGNOSIS — Z01810 Encounter for preprocedural cardiovascular examination: Secondary | ICD-10-CM | POA: Insufficient documentation

## 2022-10-04 DIAGNOSIS — F1910 Other psychoactive substance abuse, uncomplicated: Secondary | ICD-10-CM | POA: Diagnosis not present

## 2022-10-04 DIAGNOSIS — I10 Essential (primary) hypertension: Secondary | ICD-10-CM | POA: Diagnosis not present

## 2022-10-04 DIAGNOSIS — Z01812 Encounter for preprocedural laboratory examination: Secondary | ICD-10-CM | POA: Diagnosis not present

## 2022-10-04 DIAGNOSIS — M87052 Idiopathic aseptic necrosis of left femur: Secondary | ICD-10-CM

## 2022-10-04 HISTORY — DX: Myoneural disorder, unspecified: G70.9

## 2022-10-04 HISTORY — DX: Idiopathic aseptic necrosis of unspecified bone: M87.00

## 2022-10-04 HISTORY — DX: Prediabetes: R73.03

## 2022-10-04 HISTORY — DX: Essential (primary) hypertension: I10

## 2022-10-04 HISTORY — DX: Malignant (primary) neoplasm, unspecified: C80.1

## 2022-10-04 HISTORY — DX: Personal history of urinary calculi: Z87.442

## 2022-10-04 LAB — COMPREHENSIVE METABOLIC PANEL
ALT: 34 U/L (ref 0–44)
AST: 24 U/L (ref 15–41)
Albumin: 4.4 g/dL (ref 3.5–5.0)
Alkaline Phosphatase: 51 U/L (ref 38–126)
Anion gap: 10 (ref 5–15)
BUN: 10 mg/dL (ref 6–20)
CO2: 22 mmol/L (ref 22–32)
Calcium: 9 mg/dL (ref 8.9–10.3)
Chloride: 103 mmol/L (ref 98–111)
Creatinine, Ser: 0.94 mg/dL (ref 0.61–1.24)
GFR, Estimated: >60 mL/min (ref >60)
Glucose, Bld: 95 mg/dL (ref 70–99)
Potassium: 3.7 mmol/L (ref 3.5–5.1)
Sodium: 135 mmol/L (ref 135–145)
Total Bilirubin: 0.5 mg/dL (ref 0.3–1.2)
Total Protein: 7.6 g/dL (ref 6.5–8.1)

## 2022-10-04 LAB — TYPE AND SCREEN
ABO/RH(D): A NEG
Antibody Screen: NEGATIVE

## 2022-10-04 LAB — CBC
HCT: 45.3 % (ref 39.0–52.0)
Hemoglobin: 15.1 g/dL (ref 13.0–17.0)
MCH: 28.8 pg (ref 26.0–34.0)
MCHC: 33.3 g/dL (ref 30.0–36.0)
MCV: 86.3 fL (ref 80.0–100.0)
Platelets: 242 10*3/uL (ref 150–400)
RBC: 5.25 MIL/uL (ref 4.22–5.81)
RDW: 12.3 % (ref 11.5–15.5)
WBC: 7.8 10*3/uL (ref 4.0–10.5)
nRBC: 0 % (ref 0.0–0.2)

## 2022-10-07 NOTE — Progress Notes (Signed)
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 Appropriate notes have been placed on the patient's chart. This note has been routed to Dr. Charlann Boxer and Rosalene Billings, PA for review. The Patient's surgery is currently scheduled for:  10-08-2022 at [facility].  Rudean Haskell, BSN, CVRN-BC   Pre-Surgical Testing Nurse [facility]  [phone]

## 2022-10-08 ENCOUNTER — Encounter (HOSPITAL_COMMUNITY)
Admission: RE | Disposition: A | Discharge status: Home / Self Care | Payer: Self-pay | Source: Home / Self Care | Attending: Orthopedic Surgery

## 2022-10-08 ENCOUNTER — Observation Stay (HOSPITAL_COMMUNITY): Payer: Medicaid Other

## 2022-10-08 ENCOUNTER — Other Ambulatory Visit: Payer: Self-pay

## 2022-10-08 ENCOUNTER — Observation Stay (HOSPITAL_COMMUNITY)
Admission: RE | Admit: 2022-10-08 | Discharge: 2022-10-09 | Disposition: A | Discharge status: Home / Self Care | Payer: Medicaid Other | Attending: Orthopedic Surgery | Admitting: Orthopedic Surgery

## 2022-10-08 ENCOUNTER — Ambulatory Visit (HOSPITAL_COMMUNITY): Payer: Medicaid Other

## 2022-10-08 ENCOUNTER — Ambulatory Visit (HOSPITAL_BASED_OUTPATIENT_CLINIC_OR_DEPARTMENT_OTHER): Payer: Medicaid Other

## 2022-10-08 ENCOUNTER — Encounter (HOSPITAL_COMMUNITY): Payer: Self-pay | Admitting: Orthopedic Surgery

## 2022-10-08 DIAGNOSIS — J45909 Unspecified asthma, uncomplicated: Secondary | ICD-10-CM | POA: Insufficient documentation

## 2022-10-08 DIAGNOSIS — M87852 Other osteonecrosis, left femur: Secondary | ICD-10-CM

## 2022-10-08 DIAGNOSIS — F17210 Nicotine dependence, cigarettes, uncomplicated: Secondary | ICD-10-CM | POA: Diagnosis not present

## 2022-10-08 DIAGNOSIS — I10 Essential (primary) hypertension: Secondary | ICD-10-CM | POA: Insufficient documentation

## 2022-10-08 DIAGNOSIS — Z96642 Presence of left artificial hip joint: Secondary | ICD-10-CM

## 2022-10-08 DIAGNOSIS — Z79899 Other long term (current) drug therapy: Secondary | ICD-10-CM | POA: Insufficient documentation

## 2022-10-08 DIAGNOSIS — Z8546 Personal history of malignant neoplasm of prostate: Secondary | ICD-10-CM | POA: Diagnosis not present

## 2022-10-08 DIAGNOSIS — F1910 Other psychoactive substance abuse, uncomplicated: Secondary | ICD-10-CM

## 2022-10-08 DIAGNOSIS — Z01818 Encounter for other preprocedural examination: Secondary | ICD-10-CM

## 2022-10-08 DIAGNOSIS — M87052 Idiopathic aseptic necrosis of left femur: Principal | ICD-10-CM

## 2022-10-08 HISTORY — PX: TOTAL HIP ARTHROPLASTY: SHX124

## 2022-10-08 SURGERY — ARTHROPLASTY, HIP, TOTAL, ANTERIOR APPROACH
Anesthesia: Spinal | Site: Hip | Laterality: Left

## 2022-10-08 MED ORDER — BUPIVACAINE IN DEXTROSE 0.75-8.25 % IT SOLN
INTRATHECAL | Status: DC | PRN
  Administered 2022-10-08: 1.6 mL via INTRATHECAL

## 2022-10-08 MED ORDER — KETOROLAC TROMETHAMINE 30 MG/ML IJ SOLN
INTRAMUSCULAR | Status: AC
  Filled 2022-10-08: qty 1

## 2022-10-08 MED ORDER — METHOCARBAMOL 500 MG PO TABS
500.0000 mg | ORAL_TABLET | Freq: Four times a day (QID) | ORAL | Status: DC | PRN
  Administered 2022-10-08 – 2022-10-09 (×2): 500 mg via ORAL
  Filled 2022-10-08 (×2): qty 1

## 2022-10-08 MED ORDER — BISACODYL 10 MG RE SUPP: 10.0000 mg | Freq: Every day | RECTAL | Status: DC | PRN

## 2022-10-08 MED ORDER — MIDAZOLAM HCL 5 MG/5ML IJ SOLN
INTRAMUSCULAR | Status: DC | PRN
  Administered 2022-10-08 (×2): 1 mg via INTRAVENOUS

## 2022-10-08 MED ORDER — ACETAMINOPHEN 500 MG PO TABS
1000.0000 mg | ORAL_TABLET | Freq: Four times a day (QID) | ORAL | Status: DC
  Administered 2022-10-09 (×2): 1000 mg via ORAL
  Filled 2022-10-08 (×3): qty 2

## 2022-10-08 MED ORDER — CEFAZOLIN SODIUM-DEXTROSE 2-4 GM/100ML-% IV SOLN
2.0000 g | INTRAVENOUS | Status: AC
  Administered 2022-10-08: 2 g via INTRAVENOUS
  Filled 2022-10-08: qty 100

## 2022-10-08 MED ORDER — TAMSULOSIN HCL 0.4 MG PO CAPS
0.4000 mg | ORAL_CAPSULE | Freq: Every day | ORAL | Status: DC
  Administered 2022-10-08: 0.4 mg via ORAL
  Filled 2022-10-08: qty 1

## 2022-10-08 MED ORDER — DEXAMETHASONE SODIUM PHOSPHATE 10 MG/ML IJ SOLN
8.0000 mg | Freq: Once | INTRAMUSCULAR | Status: AC
  Administered 2022-10-08: 8 mg via INTRAVENOUS

## 2022-10-08 MED ORDER — ROSUVASTATIN CALCIUM 5 MG PO TABS
5.0000 mg | ORAL_TABLET | Freq: Every day | ORAL | Status: DC
  Administered 2022-10-08 – 2022-10-09 (×2): 5 mg via ORAL
  Filled 2022-10-08 (×2): qty 1

## 2022-10-08 MED ORDER — ONDANSETRON HCL 4 MG/2ML IJ SOLN
INTRAMUSCULAR | Status: DC | PRN
Start: 2022-10-08 — End: 2022-10-08
  Administered 2022-10-08: 4 mg via INTRAVENOUS

## 2022-10-08 MED ORDER — DEXAMETHASONE SODIUM PHOSPHATE 10 MG/ML IJ SOLN
10.0000 mg | Freq: Once | INTRAMUSCULAR | Status: AC
  Administered 2022-10-09: 10 mg via INTRAVENOUS
  Filled 2022-10-08: qty 1

## 2022-10-08 MED ORDER — METHOCARBAMOL 500 MG IVPB - SIMPLE MED: 500.0000 mg | Freq: Four times a day (QID) | INTRAVENOUS | Status: DC | PRN

## 2022-10-08 MED ORDER — POLYETHYLENE GLYCOL 3350 17 G PO PACK
17.0000 g | PACK | Freq: Two times a day (BID) | ORAL | Status: DC
  Administered 2022-10-08 – 2022-10-09 (×2): 17 g via ORAL
  Filled 2022-10-08 (×2): qty 1

## 2022-10-08 MED ORDER — PROPOFOL 500 MG/50ML IV EMUL
INTRAVENOUS | Status: DC | PRN
  Administered 2022-10-08: 75 ug/kg/min via INTRAVENOUS

## 2022-10-08 MED ORDER — HYDROMORPHONE HCL 1 MG/ML IJ SOLN
0.5000 mg | INTRAMUSCULAR | Status: DC | PRN
  Administered 2022-10-08: 1 mg via INTRAVENOUS
  Filled 2022-10-08: qty 1

## 2022-10-08 MED ORDER — MENTHOL 3 MG MT LOZG: 1.0000 | LOZENGE | OROMUCOSAL | Status: DC | PRN

## 2022-10-08 MED ORDER — MIDAZOLAM HCL 2 MG/2ML IJ SOLN
INTRAMUSCULAR | Status: AC
  Filled 2022-10-08: qty 2

## 2022-10-08 MED ORDER — SODIUM CHLORIDE 0.9 % IV SOLN: INTRAVENOUS | Status: DC

## 2022-10-08 MED ORDER — DEXAMETHASONE SODIUM PHOSPHATE 10 MG/ML IJ SOLN
INTRAMUSCULAR | Status: AC
  Filled 2022-10-08: qty 1

## 2022-10-08 MED ORDER — ONDANSETRON HCL 4 MG/2ML IJ SOLN: 4.0000 mg | Freq: Four times a day (QID) | INTRAMUSCULAR | Status: DC | PRN

## 2022-10-08 MED ORDER — CEFAZOLIN SODIUM-DEXTROSE 2-4 GM/100ML-% IV SOLN
2.0000 g | Freq: Four times a day (QID) | INTRAVENOUS | Status: AC
  Administered 2022-10-08 (×2): 2 g via INTRAVENOUS
  Filled 2022-10-08 (×2): qty 100

## 2022-10-08 MED ORDER — 0.9 % SODIUM CHLORIDE (POUR BTL) OPTIME
TOPICAL | Status: DC | PRN
  Administered 2022-10-08: 1000 mL

## 2022-10-08 MED ORDER — CHLORHEXIDINE GLUCONATE 4 % EX SOLN: 1.0000 | CUTANEOUS | 1 refills | Status: DC

## 2022-10-08 MED ORDER — TRANEXAMIC ACID-NACL 1000-0.7 MG/100ML-% IV SOLN
1000.0000 mg | INTRAVENOUS | Status: AC
  Administered 2022-10-08: 1000 mg via INTRAVENOUS
  Filled 2022-10-08: qty 100

## 2022-10-08 MED ORDER — KETOROLAC TROMETHAMINE 30 MG/ML IJ SOLN
INTRAMUSCULAR | Status: DC | PRN
  Administered 2022-10-08: 30 mg

## 2022-10-08 MED ORDER — PROPOFOL 1000 MG/100ML IV EMUL
INTRAVENOUS | Status: AC
  Filled 2022-10-08: qty 100

## 2022-10-08 MED ORDER — BUPIVACAINE-EPINEPHRINE 0.25% -1:200000 IJ SOLN
INTRAMUSCULAR | Status: AC
  Filled 2022-10-08: qty 1

## 2022-10-08 MED ORDER — OXYCODONE HCL 5 MG PO TABS
10.0000 mg | ORAL_TABLET | ORAL | Status: DC | PRN
  Administered 2022-10-08: 15 mg via ORAL
  Filled 2022-10-08: qty 3

## 2022-10-08 MED ORDER — GABAPENTIN 300 MG PO CAPS
300.0000 mg | ORAL_CAPSULE | Freq: Three times a day (TID) | ORAL | Status: DC
  Administered 2022-10-08 – 2022-10-09 (×3): 300 mg via ORAL
  Filled 2022-10-08 (×3): qty 1

## 2022-10-08 MED ORDER — LACTATED RINGERS IV SOLN: INTRAVENOUS | Status: DC

## 2022-10-08 MED ORDER — PHENYLEPHRINE 80 MCG/ML (10ML) SYRINGE FOR IV PUSH (FOR BLOOD PRESSURE SUPPORT)
PREFILLED_SYRINGE | INTRAVENOUS | Status: DC | PRN
  Administered 2022-10-08 (×4): 80 ug via INTRAVENOUS
  Administered 2022-10-08 (×3): 160 ug via INTRAVENOUS

## 2022-10-08 MED ORDER — METOCLOPRAMIDE HCL 5 MG/ML IJ SOLN: 5.0000 mg | Freq: Three times a day (TID) | INTRAMUSCULAR | Status: DC | PRN

## 2022-10-08 MED ORDER — ALPRAZOLAM 0.25 MG PO TABS: 0.2500 mg | ORAL_TABLET | Freq: Every day | ORAL | Status: DC | PRN

## 2022-10-08 MED ORDER — OXYCODONE HCL 5 MG PO TABS: 5.0000 mg | ORAL_TABLET | Freq: Once | ORAL | Status: DC | PRN

## 2022-10-08 MED ORDER — METOCLOPRAMIDE HCL 5 MG PO TABS: 5.0000 mg | ORAL_TABLET | Freq: Three times a day (TID) | ORAL | Status: DC | PRN

## 2022-10-08 MED ORDER — EPHEDRINE SULFATE-NACL 50-0.9 MG/10ML-% IV SOSY
PREFILLED_SYRINGE | INTRAVENOUS | Status: DC | PRN
Start: 2022-10-08 — End: 2022-10-08
  Administered 2022-10-08: 5 mg via INTRAVENOUS

## 2022-10-08 MED ORDER — ASPIRIN 81 MG PO CHEW
81.0000 mg | CHEWABLE_TABLET | Freq: Two times a day (BID) | ORAL | Status: DC
  Administered 2022-10-08 – 2022-10-09 (×2): 81 mg via ORAL
  Filled 2022-10-08 (×2): qty 1

## 2022-10-08 MED ORDER — ONDANSETRON HCL 4 MG/2ML IJ SOLN
INTRAMUSCULAR | Status: AC
  Filled 2022-10-08: qty 2

## 2022-10-08 MED ORDER — MELOXICAM 15 MG PO TABS
15.0000 mg | ORAL_TABLET | Freq: Every day | ORAL | Status: DC
  Administered 2022-10-09: 15 mg via ORAL
  Filled 2022-10-08: qty 1

## 2022-10-08 MED ORDER — OXYCODONE HCL 5 MG PO TABS
5.0000 mg | ORAL_TABLET | ORAL | Status: DC | PRN
  Administered 2022-10-08 – 2022-10-09 (×4): 10 mg via ORAL
  Filled 2022-10-08 (×4): qty 2

## 2022-10-08 MED ORDER — PROPOFOL 10 MG/ML IV BOLUS
INTRAVENOUS | Status: AC
  Filled 2022-10-08: qty 20

## 2022-10-08 MED ORDER — SODIUM CHLORIDE (PF) 0.9 % IJ SOLN
INTRAMUSCULAR | Status: AC
  Filled 2022-10-08: qty 50

## 2022-10-08 MED ORDER — ACETAMINOPHEN 10 MG/ML IV SOLN: 1000.0000 mg | Freq: Once | INTRAVENOUS | Status: DC | PRN

## 2022-10-08 MED ORDER — ORAL CARE MOUTH RINSE: 15.0000 mL | Freq: Once | OROMUCOSAL | Status: AC

## 2022-10-08 MED ORDER — CHLORHEXIDINE GLUCONATE 0.12 % MT SOLN
15.0000 mL | Freq: Once | OROMUCOSAL | Status: AC
  Administered 2022-10-08: 15 mL via OROMUCOSAL

## 2022-10-08 MED ORDER — OXYCODONE HCL 5 MG/5ML PO SOLN: 5.0000 mg | Freq: Once | ORAL | Status: DC | PRN

## 2022-10-08 MED ORDER — PHENOL 1.4 % MT LIQD: 1.0000 | OROMUCOSAL | Status: DC | PRN

## 2022-10-08 MED ORDER — DOCUSATE SODIUM 100 MG PO CAPS
100.0000 mg | ORAL_CAPSULE | Freq: Two times a day (BID) | ORAL | Status: DC
  Administered 2022-10-08 – 2022-10-09 (×2): 100 mg via ORAL
  Filled 2022-10-08 (×2): qty 1

## 2022-10-08 MED ORDER — SODIUM CHLORIDE (PF) 0.9 % IJ SOLN
INTRAMUSCULAR | Status: DC | PRN
  Administered 2022-10-08: 30 mL

## 2022-10-08 MED ORDER — HYDROMORPHONE HCL 1 MG/ML IJ SOLN: 0.2500 mg | INTRAMUSCULAR | Status: DC | PRN

## 2022-10-08 MED ORDER — PHENYLEPHRINE 80 MCG/ML (10ML) SYRINGE FOR IV PUSH (FOR BLOOD PRESSURE SUPPORT)
PREFILLED_SYRINGE | INTRAVENOUS | Status: AC
  Filled 2022-10-08: qty 10

## 2022-10-08 MED ORDER — ONDANSETRON HCL 4 MG/2ML IJ SOLN: 4.0000 mg | Freq: Once | INTRAMUSCULAR | Status: DC | PRN

## 2022-10-08 MED ORDER — TRANEXAMIC ACID-NACL 1000-0.7 MG/100ML-% IV SOLN
1000.0000 mg | Freq: Once | INTRAVENOUS | Status: AC
  Administered 2022-10-08: 1000 mg via INTRAVENOUS
  Filled 2022-10-08: qty 100

## 2022-10-08 MED ORDER — STERILE WATER FOR IRRIGATION IR SOLN
Status: DC | PRN
  Administered 2022-10-08: 2000 mL

## 2022-10-08 MED ORDER — ONDANSETRON HCL 4 MG PO TABS: 4.0000 mg | ORAL_TABLET | Freq: Four times a day (QID) | ORAL | Status: DC | PRN

## 2022-10-08 MED ORDER — PROPOFOL 10 MG/ML IV BOLUS
INTRAVENOUS | Status: DC | PRN
  Administered 2022-10-08: 20 mg via INTRAVENOUS
  Administered 2022-10-08: 40 mg via INTRAVENOUS

## 2022-10-08 MED ORDER — MUPIROCIN 2 % EX OINT: 1.0000 | TOPICAL_OINTMENT | Freq: Two times a day (BID) | CUTANEOUS | 0 refills | Status: AC

## 2022-10-08 MED ORDER — DIPHENHYDRAMINE HCL 12.5 MG/5ML PO ELIX: 12.5000 mg | ORAL_SOLUTION | ORAL | Status: DC | PRN

## 2022-10-08 MED ORDER — BUPIVACAINE-EPINEPHRINE (PF) 0.25% -1:200000 IJ SOLN
INTRAMUSCULAR | Status: DC | PRN
  Administered 2022-10-08: 30 mL

## 2022-10-08 MED ORDER — POVIDONE-IODINE 10 % EX SWAB: 2.0000 | Freq: Once | CUTANEOUS | Status: DC

## 2022-10-08 SURGICAL SUPPLY — 42 items
ADH SKN CLS APL DERMABOND .7 (GAUZE/BANDAGES/DRESSINGS) ×1
BAG COUNTER SPONGE SURGICOUNT (BAG) IMPLANT
BAG SPEC THK2 15X12 ZIP CLS (MISCELLANEOUS)
BAG SPNG CNTER NS LX DISP (BAG)
BAG ZIPLOCK 12X15 (MISCELLANEOUS) IMPLANT
BLADE SAG 18X100X1.27 (BLADE) ×1 IMPLANT
COVER PERINEAL POST (MISCELLANEOUS) ×1 IMPLANT
COVER SURGICAL LIGHT HANDLE (MISCELLANEOUS) ×1 IMPLANT
CUP ACETBLR 52 OD PINNACLE (Hips) IMPLANT
DERMABOND ADVANCED .7 DNX12 (GAUZE/BANDAGES/DRESSINGS) ×1 IMPLANT
DRAPE FOOT SWITCH (DRAPES) ×1 IMPLANT
DRAPE STERI IOBAN 125X83 (DRAPES) ×1 IMPLANT
DRAPE U-SHAPE 47X51 STRL (DRAPES) ×2 IMPLANT
DRESSING AQUACEL AG SP 3.5X10 (GAUZE/BANDAGES/DRESSINGS) ×1 IMPLANT
DRSG AQUACEL AG ADV 3.5X10 (GAUZE/BANDAGES/DRESSINGS) IMPLANT
DRSG AQUACEL AG SP 3.5X10 (GAUZE/BANDAGES/DRESSINGS) ×1
DURAPREP 26ML APPLICATOR (WOUND CARE) ×1 IMPLANT
ELECT REM PT RETURN 15FT ADLT (MISCELLANEOUS) ×1 IMPLANT
GLOVE BIO SURGEON STRL SZ 6 (GLOVE) ×1 IMPLANT
GLOVE BIOGEL PI IND STRL 6.5 (GLOVE) ×1 IMPLANT
GLOVE BIOGEL PI IND STRL 7.5 (GLOVE) ×1 IMPLANT
GLOVE ORTHO TXT STRL SZ7.5 (GLOVE) ×2 IMPLANT
GOWN STRL REUS W/ TWL LRG LVL3 (GOWN DISPOSABLE) ×2 IMPLANT
GOWN STRL REUS W/TWL LRG LVL3 (GOWN DISPOSABLE) ×2
HEAD CERAMIC DELTA 36 PLUS 1.5 (Hips) IMPLANT
HOLDER FOLEY CATH W/STRAP (MISCELLANEOUS) ×1 IMPLANT
KIT TURNOVER KIT A (KITS) IMPLANT
LINER NEUTRAL 52X36MM PLUS 4 (Liner) IMPLANT
NDL SAFETY ECLIP 18X1.5 (MISCELLANEOUS) IMPLANT
PACK ANTERIOR HIP CUSTOM (KITS) ×1 IMPLANT
SCREW 6.5MMX30MM (Screw) IMPLANT
STEM FEMORAL SZ5 HIGH ACTIS (Stem) IMPLANT
SUT MNCRL AB 4-0 PS2 18 (SUTURE) ×1 IMPLANT
SUT STRATAFIX 0 PDS 27 VIOLET (SUTURE) ×1
SUT VIC AB 1 CT1 36 (SUTURE) ×3 IMPLANT
SUT VIC AB 2-0 CT1 27 (SUTURE) ×2
SUT VIC AB 2-0 CT1 TAPERPNT 27 (SUTURE) ×2 IMPLANT
SUTURE STRATFX 0 PDS 27 VIOLET (SUTURE) ×1 IMPLANT
SYR 3ML LL SCALE MARK (SYRINGE) IMPLANT
TRAY FOLEY MTR SLVR 16FR STAT (SET/KITS/TRAYS/PACK) IMPLANT
TUBE SUCTION HIGH CAP CLEAR NV (SUCTIONS) ×1 IMPLANT
WATER STERILE IRR 1000ML POUR (IV SOLUTION) ×1 IMPLANT

## 2022-10-08 NOTE — Anesthesia Procedure Notes (Signed)
Procedure Name: MAC Date/Time: 10/08/2022 8:48 AM  Performed by: Maurene Capes, CRNAPre-anesthesia Checklist: Patient identified, Emergency Drugs available, Suction available and Patient being monitored Patient Re-evaluated:Patient Re-evaluated prior to induction Oxygen Delivery Method: Simple face mask Induction Type: IV induction Placement Confirmation: positive ETCO2 Dental Injury: Teeth and Oropharynx as per pre-operative assessment

## 2022-10-08 NOTE — Anesthesia Preprocedure Evaluation (Signed)
Anesthesia Evaluation  Patient identified by MRN, date of birth, ID band Patient awake    Reviewed: Allergy & Precautions, H&P , NPO status , Patient's Chart, lab work & pertinent test results  Airway Mallampati: II  TM Distance: >3 FB Neck ROM: Full    Dental no notable dental hx.    Pulmonary Current Smoker and Patient abstained from smoking.   Pulmonary exam normal breath sounds clear to auscultation       Cardiovascular hypertension, Normal cardiovascular exam Rhythm:Regular Rate:Normal     Neuro/Psych negative neurological ROS  negative psych ROS   GI/Hepatic Neg liver ROS,GERD  ,,  Endo/Other  negative endocrine ROS    Renal/GU negative Renal ROS  negative genitourinary   Musculoskeletal  (+) Arthritis , Osteoarthritis,    Abdominal   Peds negative pediatric ROS (+)  Hematology negative hematology ROS (+)   Anesthesia Other Findings   Reproductive/Obstetrics negative OB ROS                             Anesthesia Physical Anesthesia Plan  ASA: 2  Anesthesia Plan: Spinal   Post-op Pain Management: Regional block*   Induction: Intravenous  PONV Risk Score and Plan: 1 and Ondansetron, Propofol infusion and Treatment may vary due to age or medical condition  Airway Management Planned: Simple Face Mask  Additional Equipment:   Intra-op Plan:   Post-operative Plan:   Informed Consent: I have reviewed the patients History and Physical, chart, labs and discussed the procedure including the risks, benefits and alternatives for the proposed anesthesia with the patient or authorized representative who has indicated his/her understanding and acceptance.     Dental advisory given  Plan Discussed with: CRNA and Surgeon  Anesthesia Plan Comments:        Anesthesia Quick Evaluation

## 2022-10-08 NOTE — Interval H&P Note (Signed)
History and Physical Interval Note:  10/08/2022 6:44 AM  Edward Kelley.  has presented today for surgery, with the diagnosis of Left hip avascular necrosis.  The various methods of treatment have been discussed with the patient and family. After consideration of risks, benefits and other options for treatment, the patient has consented to  Procedure(s): TOTAL HIP ARTHROPLASTY ANTERIOR APPROACH (Left) as a surgical intervention.  The patient's history has been reviewed, patient examined, no change in status, stable for surgery.  I have reviewed the patient's chart and labs.  Questions were answered to the patient's satisfaction.     Shelda Pal

## 2022-10-08 NOTE — Anesthesia Procedure Notes (Signed)
Spinal  Patient location during procedure: OR Start time: 10/08/2022 8:50 AM End time: 10/08/2022 8:54 AM Reason for block: surgical anesthesia Staffing Performed: anesthesiologist  Anesthesiologist: Eilene Ghazi, MD Performed by: Eilene Ghazi, MD Authorized by: Eilene Ghazi, MD   Preanesthetic Checklist Completed: patient identified, IV checked, site marked, risks and benefits discussed, surgical consent, monitors and equipment checked, pre-op evaluation and timeout performed Spinal Block Patient position: sitting Prep: Betadine Patient monitoring: heart rate, continuous pulse ox and blood pressure Approach: midline Location: L4-5 Injection technique: single-shot Needle Needle type: Sprotte  Needle gauge: 24 G Needle length: 9 cm Assessment Sensory level: T6 Events: CSF return Additional Notes

## 2022-10-08 NOTE — Evaluation (Signed)
Physical Therapy Evaluation Patient Details Name: Edward Kelley. MRN: [id number] DOB: November 04, 1970 Today's Date: 10/08/2022  History of Present Illness  52 y.o. male admitted 10/08/22 for L AA-THA. PMH: AVN, prostate cancer (will have surgery soon), anxiety, asthma, migraine, GERD.  Clinical Impression  Pt is s/p THA resulting in the deficits listed below (see PT Problem List). Pt is mobilizing well, he ambulated 96' with RW, no loss of balance. Initiated THA HEP. Good progress expected.  Pt will benefit from acute skilled PT to increase their independence and safety with mobility to facilitate discharge.          If plan is discharge home, recommend the following: Assistance with cooking/housework;A little help with bathing/dressing/bathroom;Help with stairs or ramp for entrance;Assist for transportation   Can travel by private vehicle        Equipment Recommendations None recommended by PT  Recommendations for Other Services       Functional Status Assessment Patient has had a recent decline in their functional status and demonstrates the ability to make significant improvements in function in a reasonable and predictable amount of time.     Precautions / Restrictions Precautions Precautions: Fall Restrictions Weight Bearing Restrictions: No      Mobility  Bed Mobility Overal bed mobility: Needs Assistance Bed Mobility: Supine to Sit     Supine to sit: Min assist     General bed mobility comments: min A to advance LLE    Transfers Overall transfer level: Needs assistance Equipment used: Rolling walker (2 wheels) Transfers: Sit to/from Stand Sit to Stand: Min guard           General transfer comment: VCs hand placement    Ambulation/Gait Ambulation/Gait assistance: Supervision Gait Distance (Feet): 55 Feet Assistive device: Rolling walker (2 wheels) Gait Pattern/deviations: Step-to pattern, Decreased step length - right, Decreased step length -  left Gait velocity: decr     General Gait Details: VCs sequencing, no loss of balance  Stairs            Wheelchair Mobility     Tilt Bed    Modified Rankin (Stroke Patients Only)       Balance Overall balance assessment: Modified Independent                                           Pertinent Vitals/Pain Pain Assessment Pain Assessment: 0-10 Pain Score: 8  Pain Location: L hip Pain Descriptors / Indicators: Operative site guarding, Sore Pain Intervention(s): Limited activity within patient's tolerance, Monitored during session, Patient requesting pain meds-RN notified    Home Living Family/patient expects to be discharged to:: Private residence Living Arrangements: Alone     Home Access: Stairs to enter Entrance Stairs-Rails: None Entrance Stairs-Number of Steps: 4   Home Layout: One level Home Equipment: Agricultural consultant (2 wheels);Cane - single point Additional Comments: pt reports he will not have assistance available at home    Prior Function Prior Level of Function : Independent/Modified Independent;Driving             Mobility Comments: walks with SPC, no falls in past 6 months ADLs Comments: independent     Hand Dominance        Extremity/Trunk Assessment   Upper Extremity Assessment Upper Extremity Assessment: Overall WFL for tasks assessed    Lower Extremity Assessment Lower Extremity Assessment: LLE deficits/detail LLE Deficits / Details:  knee ext at least 3/5, hip AAROM WFL LLE Sensation: WNL LLE Coordination: WNL    Cervical / Trunk Assessment Cervical / Trunk Assessment: Normal  Communication   Communication: No difficulties  Cognition Arousal/Alertness: Awake/alert Behavior During Therapy: WFL for tasks assessed/performed Overall Cognitive Status: Within Functional Limits for tasks assessed                                          General Comments      Exercises Total Joint  Exercises Ankle Circles/Pumps: AROM, Both, 10 reps, Supine Heel Slides: AAROM, Left, 5 reps, Supine Hip ABduction/ADduction: AAROM, Left, 5 reps, Supine   Assessment/Plan    PT Assessment Patient needs continued PT services  PT Problem List Decreased activity tolerance;Decreased strength;Decreased balance;Decreased mobility       PT Treatment Interventions DME instruction;Therapeutic exercise;Gait training;Balance training;Stair training;Functional mobility training;Therapeutic activities;Patient/family education    PT Goals (Current goals can be found in the Care Plan section)  Acute Rehab PT Goals Patient Stated Goal: walk without pain PT Goal Formulation: With patient Time For Goal Achievement: 10/15/22 Potential to Achieve Goals: Good    Frequency Min 1X/week     Co-evaluation               AM-PAC PT "6 Clicks" Mobility  Outcome Measure Help needed turning from your back to your side while in a flat bed without using bedrails?: A Little Help needed moving from lying on your back to sitting on the side of a flat bed without using bedrails?: A Little Help needed moving to and from a bed to a chair (including a wheelchair)?: A Little Help needed standing up from a chair using your arms (e.g., wheelchair or bedside chair)?: A Little Help needed to walk in hospital room?: A Little Help needed climbing 3-5 steps with a railing? : A Lot 6 Click Score: 17    End of Session Equipment Utilized During Treatment: Gait belt Activity Tolerance: Patient tolerated treatment well Patient left: in chair;with chair alarm set;with call bell/phone within reach Nurse Communication: Mobility status PT Visit Diagnosis: Pain;Difficulty in walking, not elsewhere classified (R26.2) Pain - Right/Left: Left Pain - part of body: Hip    Time: 7846-9629 PT Time Calculation (min) (ACUTE ONLY): 19 min   Charges:   PT Evaluation $PT Eval Moderate Complexity: 1 Mod   PT General  Charges $$ ACUTE PT VISIT: 1 Visit         Tamala Ser PT 10/08/2022  Acute Rehabilitation Services  Office [phone]

## 2022-10-08 NOTE — Anesthesia Postprocedure Evaluation (Signed)
Anesthesia Post Note  Patient: Edward Kelley.  Procedure(s) Performed: TOTAL HIP ARTHROPLASTY ANTERIOR APPROACH (Left: Hip)     Patient location during evaluation: PACU Anesthesia Type: Spinal Level of consciousness: oriented and awake and alert Pain management: pain level controlled Vital Signs Assessment: post-procedure vital signs reviewed and stable Respiratory status: spontaneous breathing, respiratory function stable and patient connected to nasal cannula oxygen Cardiovascular status: blood pressure returned to baseline and stable Postop Assessment: no headache, no backache and no apparent nausea or vomiting Anesthetic complications: no  No notable events documented.  Last Vitals:  Vitals:   10/08/22 1100 10/08/22 1115  BP: 131/89 135/89  Pulse: 65 85  Resp: (!) 24 17  Temp: 36.6 C   SpO2: 100% 99%    Last Pain:  Vitals:   10/08/22 1115  TempSrc:   PainSc: 0-No pain                 Estephany Perot S

## 2022-10-08 NOTE — Transfer of Care (Signed)
Immediate Anesthesia Transfer of Care Note  Patient: Edward Kelley.  Procedure(s) Performed: TOTAL HIP ARTHROPLASTY ANTERIOR APPROACH (Left: Hip)  Patient Location: PACU  Anesthesia Type:Spinal  Level of Consciousness: awake, alert , oriented, and patient cooperative  Airway & Oxygen Therapy: Patient Spontanous Breathing and Patient connected to face mask oxygen  Post-op Assessment: Report given to RN and Post -op Vital signs reviewed and stable  Post vital signs: Reviewed and stable  Last Vitals:  Vitals Value Taken Time  BP 98/56 10/08/22 1019  Temp    Pulse 86 10/08/22 1023  Resp 16 10/08/22 1023  SpO2 100 % 10/08/22 1023  Vitals shown include unfiled device data.  Last Pain:  Vitals:   10/08/22 0644  TempSrc: Oral         Complications: No notable events documented.

## 2022-10-08 NOTE — Discharge Instructions (Signed)
INSTRUCTIONS AFTER JOINT REPLACEMENT   You will take Xarelto for 14 days for prevention of blood clot. After this, please take aspirin 81 mg twice daily for an additional month.   Pain Regimen Instructions:  - Take tylenol 1000 mg every 6-8 hours around the clock  - Take the muscle relaxant around the clock  - Then take 5 mg (1 tablet) of oxycodone every 4 hours as needed for severe pain  - Ice and elevate your leg as often as you can  Do not take over the counter pain medication until instructed by [place].  If this is not controlling your pain, or you need refills - call our office at [phone] or send a message via the [facility] portal.   Take the stool softeners provided until you are having regular bowel movements, and then you may discontinue.    Remove items at home which could result in a fall. This includes throw rugs or furniture in walking pathways ICE to the affected joint every three hours while awake for 30 minutes at a time, for at least the first 3-5 days, and then as needed for pain and swelling.  Continue to use ice for pain and swelling. You may notice swelling that will progress down to the foot and ankle.  This is normal after surgery.  Elevate your leg when you are not up walking on it.   Continue to use the breathing machine you got in the hospital (incentive spirometer) which will help keep your temperature down.  It is common for your temperature to cycle up and down following surgery, especially at night when you are not up moving around and exerting yourself.  The breathing machine keeps your lungs expanded and your temperature down.   DIET:  As you were doing prior to hospitalization, we recommend a well-balanced diet.  DRESSING / WOUND CARE / SHOWERING  Keep the surgical dressing until follow up.  The dressing is water proof, so you can shower without any extra covering.  IF THE DRESSING FALLS OFF or the wound gets wet inside, change the dressing with sterile gauze.   Please use good hand washing techniques before changing the dressing.  Do not use any lotions or creams on the incision until instructed by your surgeon.    ACTIVITY  Increase activity slowly as tolerated, but follow the weight bearing instructions below.   No driving for 6 weeks or until further direction given by your physician.  You cannot drive while taking narcotics.  No lifting or carrying greater than 10 lbs. until further directed by your surgeon. Avoid periods of inactivity such as sitting longer than an hour when not asleep. This helps prevent blood clots.  You may return to work once you are authorized by your doctor.     WEIGHT BEARING   Weight bearing as tolerated with assist device (walker, cane, etc) as directed, use it as long as suggested by your surgeon or therapist, typically at least 4-6 weeks.   EXERCISES  Results after joint replacement surgery are often greatly improved when you follow the exercise, range of motion and muscle strengthening exercises prescribed by your doctor. Safety measures are also important to protect the joint from further injury. Any time any of these exercises cause you to have increased pain or swelling, decrease what you are doing until you are comfortable again and then slowly increase them. If you have problems or questions, call your caregiver or physical therapist for advice.   Rehabilitation is  important following a joint replacement. After just a few days of immobilization, the muscles of the leg can become weakened and shrink (atrophy).  These exercises are designed to build up the tone and strength of the thigh and leg muscles and to improve motion. Often times heat used for twenty to thirty minutes before working out will loosen up your tissues and help with improving the range of motion but do not use heat for the first two weeks following surgery (sometimes heat can increase post-operative swelling).   These exercises can be done on a  training (exercise) mat, on the floor, on a table or on a bed. Use whatever works the best and is most comfortable for you.    Use music or television while you are exercising so that the exercises are a pleasant break in your day. This will make your life better with the exercises acting as a break in your routine that you can look forward to.   Perform all exercises about fifteen times, three times per day or as directed.  You should exercise both the operative leg and the other leg as well.  Exercises include:   Quad Sets - Tighten up the muscle on the front of the thigh (Quad) and hold for 5-10 seconds.   Straight Leg Raises - With your knee straight (if you were given a brace, keep it on), lift the leg to 60 degrees, hold for 3 seconds, and slowly lower the leg.  Perform this exercise against resistance later as your leg gets stronger.  Leg Slides: Lying on your back, slowly slide your foot toward your buttocks, bending your knee up off the floor (only go as far as is comfortable). Then slowly slide your foot back down until your leg is flat on the floor again.  Angel Wings: Lying on your back spread your legs to the side as far apart as you can without causing discomfort.  Hamstring Strength:  Lying on your back, push your heel against the floor with your leg straight by tightening up the muscles of your buttocks.  Repeat, but this time bend your knee to a comfortable angle, and push your heel against the floor.  You may put a pillow under the heel to make it more comfortable if necessary.   A rehabilitation program following joint replacement surgery can speed recovery and prevent re-injury in the future due to weakened muscles. Contact your doctor or a physical therapist for more information on knee rehabilitation.    CONSTIPATION  Constipation is defined medically as fewer than three stools per week and severe constipation as less than one stool per week.  Even if you have a regular bowel  pattern at home, your normal regimen is likely to be disrupted due to multiple reasons following surgery.  Combination of anesthesia, postoperative narcotics, change in appetite and fluid intake all can affect your bowels.   YOU MUST use at least one of the following options; they are listed in order of increasing strength to get the job done.  They are all available over the counter, and you may need to use some, POSSIBLY even all of these options:    Drink plenty of fluids (prune juice may be helpful) and high fiber foods Colace 100 mg by mouth twice a day  Senokot for constipation as directed and as needed Dulcolax (bisacodyl), take with full glass of water  Miralax (polyethylene glycol) once or twice a day as needed.  If you have tried all  these things and are unable to have a bowel movement in the first 3-4 days after surgery call either your surgeon or your primary doctor.    If you experience loose stools or diarrhea, hold the medications until you stool forms back up.  If your symptoms do not get better within 1 week or if they get worse, check with your doctor.  If you experience "the worst abdominal pain ever" or develop nausea or vomiting, please contact the office immediately for further recommendations for treatment.   ITCHING:  If you experience itching with your medications, try taking only a single pain pill, or even half a pain pill at a time.  You can also use Benadryl over the counter for itching or also to help with sleep.   TED HOSE STOCKINGS:  Use stockings on both legs until for at least 2 weeks or as directed by physician office. They may be removed at night for sleeping.  MEDICATIONS:  See your medication summary on the "After Visit Summary" that nursing will review with you.  You may have some home medications which will be placed on hold until you complete the course of blood thinner medication.  It is important for you to complete the blood thinner medication as  prescribed.  PRECAUTIONS:  If you experience chest pain or shortness of breath - call 911 immediately for transfer to the hospital emergency department.   If you develop a fever greater that 101 F, purulent drainage from wound, increased redness or drainage from wound, foul odor from the wound/dressing, or calf pain - CONTACT YOUR SURGEON.                                                   FOLLOW-UP APPOINTMENTS:  If you do not already have a post-op appointment, please call the office for an appointment to be seen by your surgeon.  Guidelines for how soon to be seen are listed in your "After Visit Summary", but are typically between 1-4 weeks after surgery.  OTHER INSTRUCTIONS:   Knee Replacement:  Do not place pillow under knee, focus on keeping the knee straight while resting. CPM instructions: 0-90 degrees, 2 hours in the morning, 2 hours in the afternoon, and 2 hours in the evening. Place foam block, curve side up under heel at all times except when in CPM or when walking.  DO NOT modify, tear, cut, or change the foam block in any way.  POST-OPERATIVE OPIOID TAPER INSTRUCTIONS: It is important to wean off of your opioid medication as soon as possible. If you do not need pain medication after your surgery it is ok to stop day one. Opioids include: Codeine, Hydrocodone(Norco, Vicodin), Oxycodone(Percocet, oxycontin) and hydromorphone amongst others.  Long term and even short term use of opiods can cause: Increased pain response Dependence Constipation Depression Respiratory depression And more.  Withdrawal symptoms can include Flu like symptoms Nausea, vomiting And more Techniques to manage these symptoms Hydrate well Eat regular healthy meals Stay active Use relaxation techniques(deep breathing, meditating, yoga) Do Not substitute Alcohol to help with tapering If you have been on opioids for less than two weeks and do not have pain than it is ok to stop all together.  Plan to  wean off of opioids This plan should start within one week post op of your joint replacement. Maintain  the same interval or time between taking each dose and first decrease the dose.  Cut the total daily intake of opioids by one tablet each day Next start to increase the time between doses. The last dose that should be eliminated is the evening dose.   MAKE SURE YOU:  Understand these instructions.  Get help right away if you are not doing well or get worse.    Thank you for letting us be a part of your medical care team.  It is a privilege we respect greatly.  We hope these instructions will help you stay on track for a fast and full recovery!

## 2022-10-08 NOTE — Plan of Care (Signed)
  Problem: Education: Goal: Knowledge of the prescribed therapeutic regimen will improve Outcome: Progressing Goal: Understanding of discharge needs will improve Outcome: Progressing Goal: Individualized Educational Video(s) Outcome: Progressing   Problem: Activity: Goal: Ability to avoid complications of mobility impairment will improve Outcome: Progressing   Problem: Pain Management: Goal: Pain level will decrease with appropriate interventions Outcome: Progressing   Problem: Education: Goal: Knowledge of General Education information will improve Description: Including pain rating scale, medication(s)/side effects and non-pharmacologic comfort measures Outcome: Progressing   Problem: Activity: Goal: Risk for activity intolerance will decrease Outcome: Progressing   Problem: Elimination: Goal: Will not experience complications related to bowel motility Outcome: Progressing   Problem: Pain Managment: Goal: General experience of comfort will improve Outcome: Progressing

## 2022-10-08 NOTE — Op Note (Signed)
NAME:  Edward Kelley.                ACCOUNT NO.: [id number]      MEDICAL RECORD NO.: [id number]      FACILITY:  [facility]      PHYSICIAN:  Shelda Pal  DATE OF BIRTH:  04/20/1970     DATE OF PROCEDURE:  10/08/2022                                 OPERATIVE REPORT         PREOPERATIVE DIAGNOSIS: Left  hip avascular necrosis.      POSTOPERATIVE DIAGNOSIS:  Left hip avascular necrosis.      PROCEDURE:  Left total hip replacement through an anterior approach   utilizing DePuy THR system, component size 52 mm pinnacle cup, a size 36+4 neutral   Altrex liner, a size 5 Hi Actis stem with a 36+1.5 delta ceramic   ball.      SURGEON:  Madlyn Frankel. Charlann Boxer, M.D.      ASSISTANT:  Rosalene Billings, PA-C     ANESTHESIA:  Spinal.      SPECIMENS:  None.      COMPLICATIONS:  None.      BLOOD LOSS:  300 cc     DRAINS:  None.      INDICATION OF THE PROCEDURE:  Edward Kelley. is a 52 y.o. male who had presented to office for evaluation of left hip pain.  Radiographs revealed advanced staged avascular necrosis of the left femoral head.  The patient had painful limited range of   motion significantly affecting their overall quality of life and function.  The patient was failing to respond to conservative measures including medications and/or injections and activity modification and at this point was ready to proceed with more definitive measures.  Consent was obtained for benefit of pain relief.  Specific risks of infection, DVT, component   failure, dislocation, neurovascular injury, and need for revision surgery were reviewed in the office.     PROCEDURE IN DETAIL:  The patient was brought to operative theater.   Once adequate anesthesia, preoperative antibiotics, 2 gm of Ancef, 1 gm of Tranexamic Acid, and 10 mg of Decadron were administered, the patient was positioned supine on the [employer] table.  Once the patient was safely positioned with adequate padding of boney  prominences we predraped out the hip, and used fluoroscopy to confirm orientation of the pelvis.      The left hip was then prepped and draped from proximal iliac crest to   mid thigh with a shower curtain technique.      Time-out was performed identifying the patient, planned procedure, and the appropriate extremity.     An incision was then made 2 cm lateral to the   anterior superior iliac spine extending over the orientation of the   tensor fascia lata muscle and sharp dissection was carried down to the   fascia of the muscle.      The fascia was then incised.  The muscle belly was identified and swept   laterally and retractor placed along the superior neck.  Following   cauterization of the circumflex vessels and removing some pericapsular   fat, a second cobra retractor was placed on the inferior neck.  A T-capsulotomy was made along the line of the   superior neck to the  trochanteric fossa, then extended proximally and   distally.  Tag sutures were placed and the retractors were then placed   intracapsular.  We then identified the trochanteric fossa and   orientation of my neck cut and then made a neck osteotomy with the femur on traction.  The femoral   head was removed without difficulty or complication.  Traction was let   off and retractors were placed posterior and anterior around the   acetabulum.      The labrum and foveal tissue were debrided.  I began reaming with a 48 mm   reamer and reamed up to 51 mm reamer with good bony bed preparation and a 52 mm  cup was chosen.  The final 52 mm Pinnacle cup was then impacted under fluoroscopy to confirm the depth of penetration and orientation with respect to   Abduction and forward flexion.  A screw was placed into the ilium followed by the hole eliminator.  The final   36+4 neutral Altrex liner was impacted with good visualized rim fit.  The cup was positioned anatomically within the acetabular portion of the pelvis.      At  this point, the femur was rolled to 100 degrees.  Further capsule was   released off the inferior aspect of the femoral neck.  I then   released the superior capsule proximally.  With the leg in a neutral position the hook was placed laterally   along the femur under the vastus lateralis origin and elevated manually and then held in position using the hook attachment on the bed.  The leg was then extended and adducted with the leg rolled to 100   degrees of external rotation.  Retractors were placed along the medial calcar and posteriorly over the greater trochanter.  Once the proximal femur was fully   exposed, I used a box osteotome to set orientation.  I then began   broaching with the starting chili pepper broach and passed this by hand and then broached up to 5.  With the 5 broach in place I chose a high offset neck and did several trial reductions.  The offset was appropriate, leg lengths   appeared to be equal best matched with the +1.5 head ball trial confirmed radiographically.   Given these findings, I went ahead and dislocated the hip, repositioned all   retractors and positioned the right hip in the extended and abducted position.  The final 5 Hi Actis stem was   chosen and it was impacted down to the level of neck cut.  Based on this   and the trial reductions, a final 36+1.5 delta ceramic ball was chosen and   impacted onto a clean and dry trunnion, and the hip was reduced.  The   hip had been irrigated throughout the case again at this point.  I did   reapproximate the superior capsular leaflet to the anterior leaflet   using #1 Vicryl.  The fascia of the   tensor fascia lata muscle was then reapproximated using #1 Vicryl and #0 Stratafix sutures.  The   remaining wound was closed with 2-0 Vicryl and running 4-0 Monocryl.   The hip was cleaned, dried, and dressed sterilely using Dermabond and   Aquacel dressing.  The patient was then brought   to recovery room in stable condition  tolerating the procedure well.    Rosalene Billings, PA-C was present for the entirety of the case involved from   preoperative positioning, perioperative  retractor management, general   facilitation of the case, as well as primary wound closure as assistant.            Madlyn Frankel Charlann Boxer, M.D.        10/08/2022 8:39 AM

## 2022-10-09 ENCOUNTER — Encounter (HOSPITAL_COMMUNITY): Payer: Self-pay | Admitting: Orthopedic Surgery

## 2022-10-09 DIAGNOSIS — M87852 Other osteonecrosis, left femur: Secondary | ICD-10-CM | POA: Diagnosis not present

## 2022-10-09 MED ORDER — POLYETHYLENE GLYCOL 3350 17 G PO PACK: 17.0000 g | PACK | Freq: Two times a day (BID) | ORAL | 0 refills | Status: DC

## 2022-10-09 MED ORDER — OXYCODONE HCL 5 MG PO TABS: 5.0000 mg | ORAL_TABLET | ORAL | 0 refills | Status: DC | PRN

## 2022-10-09 MED ORDER — ASPIRIN 81 MG PO CHEW: 81.0000 mg | CHEWABLE_TABLET | Freq: Two times a day (BID) | ORAL | 0 refills | Status: AC

## 2022-10-09 MED ORDER — TIZANIDINE HCL 4 MG PO TABS: 2.0000 mg | ORAL_TABLET | Freq: Three times a day (TID) | ORAL | Status: DC | PRN

## 2022-10-09 MED ORDER — SENNA 8.6 MG PO TABS: 2.0000 | ORAL_TABLET | Freq: Every day | ORAL | 0 refills | Status: AC

## 2022-10-09 MED ORDER — SODIUM CHLORIDE 0.9 % IV BOLUS
500.0000 mL | Freq: Once | INTRAVENOUS | Status: AC
  Administered 2022-10-09: 500 mL via INTRAVENOUS

## 2022-10-09 MED ORDER — CEFADROXIL 500 MG PO CAPS: 500.0000 mg | ORAL_CAPSULE | Freq: Two times a day (BID) | ORAL | 0 refills | Status: AC

## 2022-10-09 MED ORDER — TIZANIDINE HCL 2 MG PO TABS: 2.0000 mg | ORAL_TABLET | Freq: Three times a day (TID) | ORAL | 2 refills | Status: AC | PRN

## 2022-10-09 NOTE — TOC Transition Note (Signed)
Transition of Care [facility]) - CM/SW Discharge Note   Patient Details  Name: Edward Kelley. MRN: [id number] Date of Birth: 01/01/1971  Transition of Care [facility]) CM/SW Contact:  Amada Jupiter, LCSW Phone Number: 10/09/2022, 10:14 AM   Clinical Narrative:    Met briefly with pt who confirms he has needed DME in the home.  Plan for HEP.  No further TOC needs.   Final next level of care: Home/Self Care Barriers to Discharge: No Barriers Identified   Patient Goals and CMS Choice      Discharge Placement                         Discharge Plan and Services Additional resources added to the After Visit Summary for                  DME Arranged: N/A DME Agency: NA                  Social Determinants of Health (SDOH) Interventions SDOH Screenings   Food Insecurity: No Food Insecurity (10/08/2022)  Housing: Low Risk  (10/08/2022)  Transportation Needs: No Transportation Needs (10/08/2022)  Utilities: Not At Risk (10/08/2022)  Tobacco Use: High Risk (10/08/2022)     Readmission Risk Interventions     No data to display

## 2022-10-09 NOTE — Progress Notes (Signed)
   Subjective: 1 Day Post-Op Procedure(s) (LRB): TOTAL HIP ARTHROPLASTY ANTERIOR APPROACH (Left) Patient reports pain as mild.   Patient seen in rounds by Dr. Charlann Boxer. Patient is resting in bed on exam this morning. No acute events overnight. Foley catheter removed. Patient ambulated 55 feet with PT yesterday. We will start therapy today.   Objective: Vital signs in last 24 hours: Temp:  [97.4 F (36.3 C)-97.9 F (36.6 C)] 97.5 F (36.4 C) (07/31 0417) Pulse Rate:  [57-107] 72 (07/31 0417) Resp:  [11-24] 17 (07/31 0417) BP: (98-135)/(56-108) 98/77 (07/31 0417) SpO2:  [94 %-100 %] 94 % (07/31 0417)  Intake/Output from previous day:  Intake/Output Summary (Last 24 hours) at 10/09/2022 0727 Last data filed at 10/09/2022 0439 Gross per 24 hour  Intake 3060 ml  Output 2375 ml  Net 685 ml     Intake/Output this shift: No intake/output data recorded.  Labs: Recent Labs    10/09/22 0344  HGB 9.7*   Recent Labs    10/09/22 0344  WBC 13.7*  RBC 3.31*  HCT 29.0*  PLT 169   Recent Labs    10/09/22 0344  NA 133*  K 4.2  CL 101  CO2 25  BUN 14  CREATININE 0.91  GLUCOSE 146*  CALCIUM 8.4*   No results for input(s): "LABPT", "INR" in the last 72 hours.  Exam: General - Patient is Alert and Oriented Extremity - Neurologically intact Sensation intact distally Intact pulses distally Dorsiflexion/Plantar flexion intact Dressing - dressing C/D/I Motor Function - intact, moving foot and toes well on exam.   Past Medical History:  Diagnosis Date   Anxiety    Arthritis    "neck & shoulders" (10/12/2015)   Asthma    Avascular necrosis (HCC)    Bursitis    "neck & shoulders"   Cancer (HCC)    Prostate Ca,  new dx 08-2022   Chronic back pain    "all over" (10/12/2015)   Dental disease    advanced/notes 10/12/2015   GERD (gastroesophageal reflux disease)    Headache    "weekly" (10/12/2015)   History of kidney stones    Hypertension    Kidney stone    "currently"  (10/12/2015)   Migraine    "weekly" (10/12/2015)   Neuromuscular disorder (HCC)    sciatica   Pre-diabetes     Assessment/Plan: 1 Day Post-Op Procedure(s) (LRB): TOTAL HIP ARTHROPLASTY ANTERIOR APPROACH (Left) Principal Problem:   S/P total left hip arthroplasty  Estimated body mass index is 27.1 kg/m as calculated from the following:   Height as of this encounter: 5\' 7"  (1.702 m).   Weight as of this encounter: 78.5 kg. Advance diet Up with therapy D/C IV fluids  DVT Prophylaxis - Xarelto (due to underlying prostate cancer) Weight bearing as tolerated.  ABLA: Hgb 15.1 > 9.7 - stable, will send home on PO iron  Plan is to go Home after hospital stay. Plan for discharge today after meeting goals with therapy. Follow up in the office in 2 weeks.   Rosalene Billings, PA-C Orthopedic Surgery [phone] 10/09/2022, 7:27 AM

## 2022-10-09 NOTE — Progress Notes (Signed)
Physical Therapy Treatment Patient Details Name: Edward Kelley. MRN: [id number] DOB: [birth year] Today's Date: 10/09/2022   History of Present Illness 52 y.o. male admitted 10/08/22 for L AA-THA. PMH: AVN, prostate cancer (will have surgery soon), anxiety, asthma, migraine, GERD.    PT Comments  Pt is progressing well with mobility, he ambulated 150' with RW, completed stair training, and demonstrates good understanding of HEP. He is ready to DC home from a PT standpoint.      If plan is discharge home, recommend the following: Assistance with cooking/housework;A little help with bathing/dressing/bathroom;Help with stairs or ramp for entrance;Assist for transportation   Can travel by private vehicle        Equipment Recommendations  None recommended by PT    Recommendations for Other Services       Precautions / Restrictions Precautions Precautions: Fall Restrictions Weight Bearing Restrictions: No     Mobility  Bed Mobility Overal bed mobility: Modified Independent Bed Mobility: Supine to Sit     Supine to sit: Modified independent (Device/Increase time), HOB elevated          Transfers Overall transfer level: Needs assistance Equipment used: Rolling walker (2 wheels) Transfers: Sit to/from Stand Sit to Stand: Supervision           General transfer comment: VCs hand placement    Ambulation/Gait Ambulation/Gait assistance: Supervision Gait Distance (Feet): 150 Feet Assistive device: Rolling walker (2 wheels) Gait Pattern/deviations: Step-to pattern, Decreased step length - right, Decreased step length - left, Step-through pattern Gait velocity: decr     General Gait Details: step to progressing to step through, no loss of balance   Stairs Stairs: Yes Stairs assistance: Supervision Stair Management: One rail Left, Sideways, Step to pattern Number of Stairs: 3 General stair comments: VCs sequencing, pt stated he also has a ramp he can  use   Wheelchair Mobility     Tilt Bed    Modified Rankin (Stroke Patients Only)       Balance Overall balance assessment: Modified Independent                                          Cognition Arousal/Alertness: Awake/alert Behavior During Therapy: WFL for tasks assessed/performed Overall Cognitive Status: Within Functional Limits for tasks assessed                                          Exercises Total Joint Exercises Ankle Circles/Pumps: AROM, Both, 10 reps, Supine Quad Sets: AROM, Both, 5 reps, Supine Short Arc Quad: AROM, Left, 5 reps, Supine Heel Slides: AAROM, Left, 5 reps, Supine Hip ABduction/ADduction: AAROM, Left, 5 reps, Supine Long Arc Quad: AROM, Left, 5 reps, Seated    General Comments        Pertinent Vitals/Pain Pain Assessment Pain Score: 7  Pain Location: L hip Pain Descriptors / Indicators: Operative site guarding, Sore Pain Intervention(s): Limited activity within patient's tolerance, Monitored during session, Premedicated before session, Ice applied    Home Living                          Prior Function            PT Goals (current goals can now be found in the care plan  section) Acute Rehab PT Goals Patient Stated Goal: walk without pain PT Goal Formulation: With patient Time For Goal Achievement: 10/15/22 Potential to Achieve Goals: Good Progress towards PT goals: Goals met/education completed, patient discharged from PT    Frequency    Min 1X/week      PT Plan Current plan remains appropriate    Co-evaluation              AM-PAC PT "6 Clicks" Mobility   Outcome Measure  Help needed turning from your back to your side while in a flat bed without using bedrails?: None Help needed moving from lying on your back to sitting on the side of a flat bed without using bedrails?: None Help needed moving to and from a bed to a chair (including a wheelchair)?: None Help  needed standing up from a chair using your arms (e.g., wheelchair or bedside chair)?: None Help needed to walk in hospital room?: None Help needed climbing 3-5 steps with a railing? : A Little 6 Click Score: 23    End of Session Equipment Utilized During Treatment: Gait belt Activity Tolerance: Patient tolerated treatment well Patient left: in chair;with chair alarm set;with call bell/phone within reach Nurse Communication: Mobility status PT Visit Diagnosis: Pain;Difficulty in walking, not elsewhere classified (R26.2) Pain - Right/Left: Left Pain - part of body: Hip     Time: 2841-3244 PT Time Calculation (min) (ACUTE ONLY): 33 min  Charges:    $Gait Training: 8-22 mins $Therapeutic Exercise: 8-22 mins PT General Charges $$ ACUTE PT VISIT: 1 Visit                     Tamala Ser PT 10/09/2022  Acute Rehabilitation Services  Office [phone]

## 2022-10-09 NOTE — Plan of Care (Signed)
  Problem: Education: Goal: Knowledge of the prescribed therapeutic regimen will improve Outcome: Adequate for Discharge Goal: Understanding of discharge needs will improve Outcome: Adequate for Discharge Goal: Individualized Educational Video(s) Outcome: Adequate for Discharge   Problem: Activity: Goal: Ability to avoid complications of mobility impairment will improve Outcome: Adequate for Discharge Goal: Ability to tolerate increased activity will improve Outcome: Adequate for Discharge   Problem: Clinical Measurements: Goal: Postoperative complications will be avoided or minimized Outcome: Adequate for Discharge   Problem: Pain Management: Goal: Pain level will decrease with appropriate interventions Outcome: Adequate for Discharge   Problem: Education: Goal: Knowledge of General Education information will improve Description: Including pain rating scale, medication(s)/side effects and non-pharmacologic comfort measures Outcome: Adequate for Discharge   Problem: Health Behavior/Discharge Planning: Goal: Ability to manage health-related needs will improve Outcome: Adequate for Discharge   Problem: Clinical Measurements: Goal: Ability to maintain clinical measurements within normal limits will improve Outcome: Adequate for Discharge Goal: Will remain free from infection Outcome: Adequate for Discharge Goal: Diagnostic test results will improve Outcome: Adequate for Discharge Goal: Respiratory complications will improve Outcome: Adequate for Discharge Goal: Cardiovascular complication will be avoided Outcome: Adequate for Discharge   Problem: Activity: Goal: Risk for activity intolerance will decrease Outcome: Adequate for Discharge   Problem: Nutrition: Goal: Adequate nutrition will be maintained Outcome: Adequate for Discharge   Problem: Coping: Goal: Level of anxiety will decrease Outcome: Adequate for Discharge   Problem: Elimination: Goal: Will not  experience complications related to bowel motility Outcome: Adequate for Discharge Goal: Will not experience complications related to urinary retention Outcome: Adequate for Discharge   Problem: Pain Managment: Goal: General experience of comfort will improve Outcome: Adequate for Discharge

## 2022-10-15 NOTE — Discharge Summary (Signed)
Patient ID: Edward Kelley. MRN: [id number] DOB/AGE: 52/30/1972 52 y.o.  Admit date: 10/08/2022 Discharge date: 10/09/2022  Admission Diagnoses:  Left hip avascular necrosis  Discharge Diagnoses:  Principal Problem:   S/P total left hip arthroplasty   Past Medical History:  Diagnosis Date   Anxiety    Arthritis    "neck & shoulders" (10/12/2015)   Asthma    Avascular necrosis (HCC)    Bursitis    "neck & shoulders"   Cancer (HCC)    Prostate Ca,  new dx 08-2022   Chronic back pain    "all over" (10/12/2015)   Dental disease    advanced/notes 10/12/2015   GERD (gastroesophageal reflux disease)    Headache    "weekly" (10/12/2015)   History of kidney stones    Hypertension    Kidney stone    "currently" (10/12/2015)   Migraine    "weekly" (10/12/2015)   Neuromuscular disorder (HCC)    sciatica   Pre-diabetes     Surgeries: Procedure(s): TOTAL HIP ARTHROPLASTY ANTERIOR APPROACH on 10/08/2022   Consultants:   Discharged Condition: Improved  Hospital Course: Edward Fertitta. is an 52 y.o. male who was admitted 10/08/2022 for operative treatment ofS/P total left hip arthroplasty. Patient has severe unremitting pain that affects sleep, daily activities, and work/hobbies. After pre-op clearance the patient was taken to the operating room on 10/08/2022 and underwent  Procedure(s): TOTAL HIP ARTHROPLASTY ANTERIOR APPROACH.    Patient was given perioperative antibiotics:  Anti-infectives (From admission, onward)    Start     Dose/Rate Route Frequency Ordered Stop   10/09/22 0000  cefadroxil (DURICEF) 500 MG capsule        500 mg Oral 2 times daily 10/09/22 0731 10/16/22 2359   10/08/22 1500  ceFAZolin (ANCEF) IVPB 2g/100 mL premix        2 g 200 mL/hr over 30 Minutes Intravenous Every 6 hours 10/08/22 1423 10/08/22 2210   10/08/22 0630  ceFAZolin (ANCEF) IVPB 2g/100 mL premix        2 g 200 mL/hr over 30 Minutes Intravenous On call to O.R. 10/08/22 4098 10/08/22 0849         Patient was given sequential compression devices, early ambulation, and chemoprophylaxis to prevent DVT. Patient worked with PT and was meeting their goals regarding safe ambulation and transfers.  Patient benefited maximally from hospital stay and there were no complications.    Recent vital signs: No data found.   Recent laboratory studies: No results for input(s): "WBC", "HGB", "HCT", "PLT", "NA", "K", "CL", "CO2", "BUN", "CREATININE", "GLUCOSE", "INR", "CALCIUM" in the last 72 hours.  Invalid input(s): "PT", "2"   Discharge Medications:   Allergies as of 10/09/2022   No Known Allergies      Medication List     STOP taking these medications    HYDROcodone-acetaminophen 7.5-325 MG tablet Commonly known as: NORCO   meloxicam 7.5 MG tablet Commonly known as: MOBIC   methocarbamol 500 MG tablet Commonly known as: ROBAXIN       TAKE these medications    acetaminophen 500 MG tablet Commonly known as: TYLENOL Take 500 mg by mouth every 6 (six) hours as needed for headache.   ALPRAZolam 0.25 MG tablet Commonly known as: XANAX Take 0.25 mg by mouth daily as needed for anxiety.   aspirin 81 MG chewable tablet Chew 1 tablet (81 mg total) by mouth 2 (two) times daily for 28 days.   cefadroxil 500 MG capsule Commonly known  as: DURICEF Take 1 capsule (500 mg total) by mouth 2 (two) times daily for 7 days.   chlorhexidine 4 % external liquid Commonly known as: HIBICLENS Apply 15 mLs (1 Application total) topically as directed for 30 doses. Use as directed daily for 5 days every other week for 6 weeks.   gabapentin 300 MG capsule Commonly known as: NEURONTIN Take 300 mg by mouth 3 (three) times daily.   mupirocin ointment 2 % Commonly known as: BACTROBAN Place 1 Application into the nose 2 (two) times daily for 60 doses. Use as directed 2 times daily for 5 days every other week for 6 weeks.   oxyCODONE 5 MG immediate release tablet Commonly known as: Oxy  IR/ROXICODONE Take 1 tablet (5 mg total) by mouth every 4 (four) hours as needed for severe pain.   polyethylene glycol 17 g packet Commonly known as: MIRALAX / GLYCOLAX Take 17 g by mouth 2 (two) times daily.   rosuvastatin 5 MG tablet Commonly known as: CRESTOR Take 5 mg by mouth daily.   senna 8.6 MG Tabs tablet Commonly known as: SENOKOT Take 2 tablets (17.2 mg total) by mouth at bedtime for 14 days.   tamsulosin 0.4 MG Caps capsule Commonly known as: FLOMAX Take 0.4 mg by mouth at bedtime.   tiZANidine 2 MG tablet Commonly known as: ZANAFLEX Take 1 tablet (2 mg total) by mouth every 8 (eight) hours as needed for muscle spasms.               Discharge Care Instructions  (From admission, onward)           Start     Ordered   10/09/22 0000  Change dressing       Comments: Maintain surgical dressing until follow up in the clinic. If the edges start to pull up, may reinforce with tape. If the dressing is no longer working, may remove and cover with gauze and tape, but must keep the area dry and clean.  Call with any questions or concerns.   10/09/22 0731            Diagnostic Studies: DG Pelvis Portable  Result Date: 10/08/2022 CLINICAL DATA:  Status post total left hip arthroplasty. EXAM: PORTABLE PELVIS 1-2 VIEWS COMPARISON:  PET-CT 09/13/2022, CT abdomen and pelvis 10/12/2015 FINDINGS: Interval total left hip arthroplasty. No perihardware lucency is seen to indicate hardware failure or loosening. The pubic symphysis and right sacroiliac joint spaces are maintained. Expected postoperative changes including lateral left hip subcutaneous air. No acute fracture or dislocation. IMPRESSION: Interval total left hip arthroplasty without evidence of hardware failure or loosening. Electronically Signed   By: Neita Garnet M.D.   On: 10/08/2022 11:59   DG HIP UNILAT WITH PELVIS 1V LEFT  Result Date: 10/08/2022 CLINICAL DATA:  Total left hip arthroplasty. Intraoperative  fluoroscopy. EXAM: DG HIP (WITH OR WITHOUT PELVIS) 1V*L* COMPARISON:  PET-CT 09/13/2022 FINDINGS: Images were performed intraoperatively without the presence of a radiologist. The patient is undergoing total left hip arthroplasty. No hardware complication is seen. Total fluoroscopy images: 2 Total fluoroscopy time: 9 seconds Total dose: Radiation Exposure Index (as provided by the fluoroscopic device): 1.16 mGy air Kerma Please see intraoperative findings for further detail. IMPRESSION: Intraoperative fluoroscopy for total left hip arthroplasty. Electronically Signed   By: Neita Garnet M.D.   On: 10/08/2022 11:58   DG C-Arm 1-60 Min-No Report  Result Date: 10/08/2022 Fluoroscopy was utilized by the requesting physician.  No radiographic interpretation.  Disposition: Discharge disposition: 01-Home or Self Care       Discharge Instructions     Call MD / Call 911   Complete by: As directed    If you experience chest pain or shortness of breath, CALL 911 and be transported to the hospital emergency room.  If you develope a fever above 101 F, pus (white drainage) or increased drainage or redness at the wound, or calf pain, call your surgeon's office.   Change dressing   Complete by: As directed    Maintain surgical dressing until follow up in the clinic. If the edges start to pull up, may reinforce with tape. If the dressing is no longer working, may remove and cover with gauze and tape, but must keep the area dry and clean.  Call with any questions or concerns.   Constipation Prevention   Complete by: As directed    Drink plenty of fluids.  Prune juice may be helpful.  You may use a stool softener, such as Colace (over the counter) 100 mg twice a day.  Use MiraLax (over the counter) for constipation as needed.   Diet - low sodium heart healthy   Complete by: As directed    Increase activity slowly as tolerated   Complete by: As directed    Weight bearing as tolerated with assist device  (walker, cane, etc) as directed, use it as long as suggested by your surgeon or therapist, typically at least 4-6 weeks.   Post-operative opioid taper instructions:   Complete by: As directed    POST-OPERATIVE OPIOID TAPER INSTRUCTIONS: It is important to wean off of your opioid medication as soon as possible. If you do not need pain medication after your surgery it is ok to stop day one. Opioids include: Codeine, Hydrocodone(Norco, Vicodin), Oxycodone(Percocet, oxycontin) and hydromorphone amongst others.  Long term and even short term use of opiods can cause: Increased pain response Dependence Constipation Depression Respiratory depression And more.  Withdrawal symptoms can include Flu like symptoms Nausea, vomiting And more Techniques to manage these symptoms Hydrate well Eat regular healthy meals Stay active Use relaxation techniques(deep breathing, meditating, yoga) Do Not substitute Alcohol to help with tapering If you have been on opioids for less than two weeks and do not have pain than it is ok to stop all together.  Plan to wean off of opioids This plan should start within one week post op of your joint replacement. Maintain the same interval or time between taking each dose and first decrease the dose.  Cut the total daily intake of opioids by one tablet each day Next start to increase the time between doses. The last dose that should be eliminated is the evening dose.      TED hose   Complete by: As directed    Use stockings (TED hose) for 2 weeks on both leg(s).  You may remove them at night for sleeping.        Follow-up Information     Durene Romans, MD. Schedule an appointment as soon as possible for a visit in 2 week(s).   Specialty: Orthopedic Surgery Contact information: [address] [place] [address] [place] [address] [phone]                  Signed: Cassandria Anger 10/15/2022, 7:19 AM

## 2022-10-23 ENCOUNTER — Other Ambulatory Visit: Payer: Self-pay | Admitting: Urology

## 2022-11-14 ENCOUNTER — Ambulatory Visit: Payer: Medicaid Other | Attending: Urology | Admitting: Physical Therapy

## 2022-11-14 DIAGNOSIS — R293 Abnormal posture: Secondary | ICD-10-CM | POA: Insufficient documentation

## 2022-11-14 DIAGNOSIS — M6281 Muscle weakness (generalized): Secondary | ICD-10-CM | POA: Insufficient documentation

## 2022-11-14 DIAGNOSIS — C61 Malignant neoplasm of prostate: Secondary | ICD-10-CM | POA: Insufficient documentation

## 2022-11-14 DIAGNOSIS — R279 Unspecified lack of coordination: Secondary | ICD-10-CM | POA: Insufficient documentation

## 2022-11-14 DIAGNOSIS — R269 Unspecified abnormalities of gait and mobility: Secondary | ICD-10-CM | POA: Insufficient documentation

## 2022-11-18 IMAGING — DX DG CHEST 1V PORT
1 series · 1 of 1 positions shown · non-contrast
Comparison: 06/22/2019

CLINICAL DATA: Mid back pain for 2 weeks

EXAM:
PORTABLE CHEST 1 VIEW

[chest]
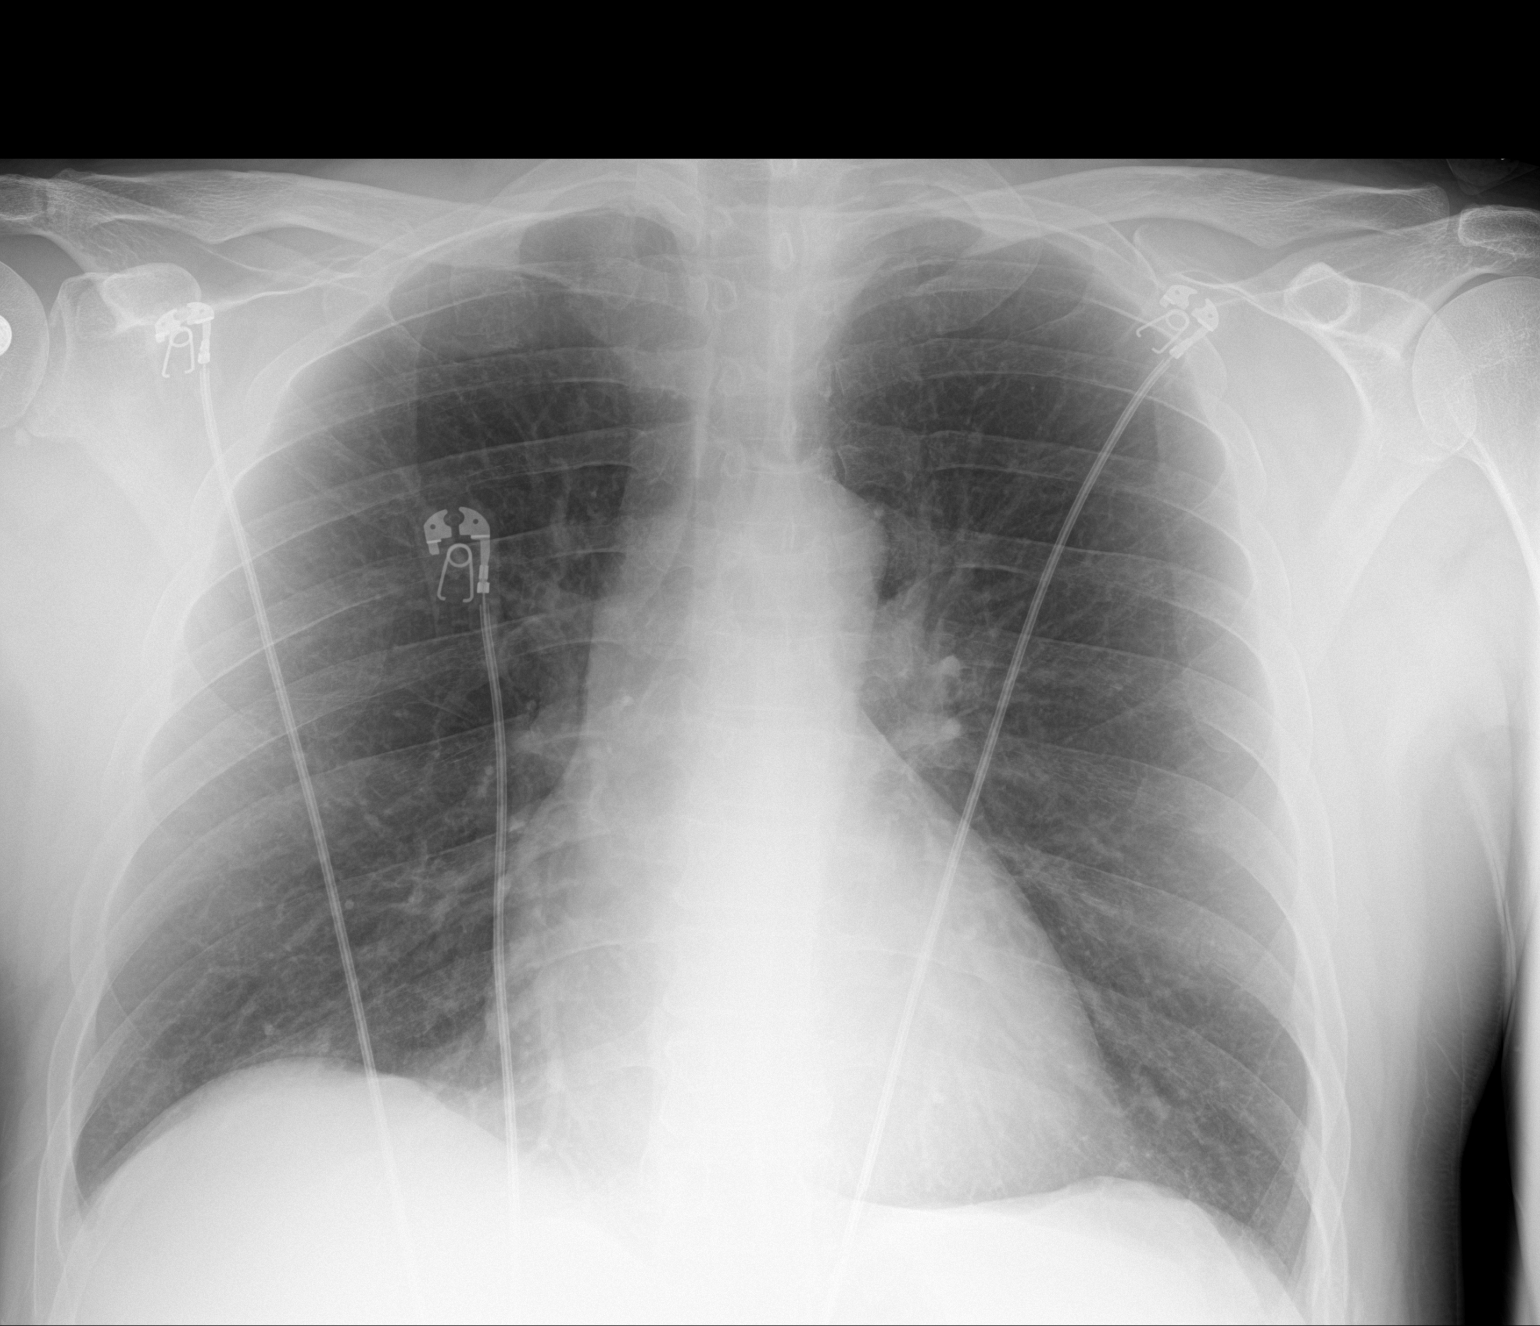

[1 of 1 positions shown; findings below may reference images not displayed]

FINDINGS: Normal heart size and mediastinal contours. No acute infiltrate or
edema. No effusion or pneumothorax. No acute osseous findings.
IMPRESSION: Negative chest.

## 2022-11-26 ENCOUNTER — Ambulatory Visit: Payer: Medicaid Other

## 2022-12-03 ENCOUNTER — Ambulatory Visit: Payer: Medicaid Other | Admitting: Physical Therapy

## 2022-12-03 ENCOUNTER — Other Ambulatory Visit: Payer: Self-pay

## 2022-12-03 DIAGNOSIS — M6281 Muscle weakness (generalized): Secondary | ICD-10-CM | POA: Diagnosis not present

## 2022-12-03 DIAGNOSIS — R279 Unspecified lack of coordination: Secondary | ICD-10-CM

## 2022-12-03 DIAGNOSIS — R293 Abnormal posture: Secondary | ICD-10-CM

## 2022-12-03 DIAGNOSIS — R269 Unspecified abnormalities of gait and mobility: Secondary | ICD-10-CM | POA: Diagnosis not present

## 2022-12-03 DIAGNOSIS — C61 Malignant neoplasm of prostate: Secondary | ICD-10-CM | POA: Diagnosis present

## 2022-12-03 NOTE — Therapy (Signed)
OUTPATIENT PHYSICAL THERAPY MALE PELVIC EVALUATION   Patient Name: Edward Kelley. MRN: [id number] DOB:[birth year], 52 y.o., male Today's Date: 12/03/2022  END OF SESSION:  PT End of Session - 12/03/22 1419     Visit Number 1    Date for PT Re-Evaluation 04/04/23    Authorization Type medicaid HEALTHY BLUE    PT Start Time 1416   arrical time   PT Stop Time 1443    PT Time Calculation (min) 27 min    Activity Tolerance Patient tolerated treatment well    Behavior During Therapy WFL for tasks assessed/performed             Past Medical History:  Diagnosis Date   Anxiety    Arthritis    "neck & shoulders" (10/12/2015)   Asthma    Avascular necrosis (HCC)    Bursitis    "neck & shoulders"   Cancer (HCC)    Prostate Ca,  new dx 08-2022   Chronic back pain    "all over" (10/12/2015)   Dental disease    advanced/notes 10/12/2015   GERD (gastroesophageal reflux disease)    Headache    "weekly" (10/12/2015)   History of kidney stones    Hypertension    Kidney stone    "currently" (10/12/2015)   Migraine    "weekly" (10/12/2015)   Neuromuscular disorder (HCC)    sciatica   Pre-diabetes    Past Surgical History:  Procedure Laterality Date   HERNIA REPAIR Right    did with appendectomy   LAPAROSCOPIC APPENDECTOMY N/A 10/12/2015   Procedure: APPENDECTOMY LAPAROSCOPIC;  Surgeon: Gaynelle Adu, MD;  Location: [facility];  Service: General;  Laterality: N/A;   TOTAL HIP ARTHROPLASTY Left 10/08/2022   Procedure: TOTAL HIP ARTHROPLASTY ANTERIOR APPROACH;  Surgeon: Durene Romans, MD;  Location: [facility] ORS;  Service: Orthopedics;  Laterality: Left;   WISDOM TOOTH EXTRACTION     Patient Active Problem List   Diagnosis Date Noted   S/P total left hip arthroplasty 10/08/2022   Asthma exacerbation 06/02/2019   Acute appendicitis 10/12/2015    PCP: Effie Shy, MD  REFERRING PROVIDER: Noel Christmas, MD    REFERRING DIAG: C61 (ICD-10-CM) - Prostate cancer [facility])  THERAPY DIAG:   Muscle weakness (generalized)  Abnormality of gait and mobility  Abnormal posture  Unspecified lack of coordination  Rationale for Evaluation and Treatment: Rehabilitation  ONSET DATE: may 2024  SUBJECTIVE:  SUBJECTIVE STATEMENT: Pt reports having prostate removed 10/7 found cancer with pre - op for LT hip replacement on 10/08/22. Reports he hasn't had PT for hip.   Fluid intake: water - "not really much" 16-24oz; teas/sodas/coffee for most of day  PAIN:  Are you having pain? No - not with bladder or prostate but does have Lt hip pain post replacement. Constant but worse with prolonged standing and walking. Limits home care/laundry/cleaning. Worse at the end of the day, 0-8/10 does have some numbness at upper lateral Lt thigh   PRECAUTIONS: Other: active prostate cancer; fall  RED FLAGS: None   WEIGHT BEARING RESTRICTIONS: No  FALLS:  Has patient fallen in last 6 months? Yes. Number of falls after hip replacement reports falls (unsure exactly how many but a couple)  LIVING ENVIRONMENT: Lives with: lives alone Lives in: House/apartment Stairs:  3 STE handrails on both side, ramp Has following equipment at home: Single point cane  OCCUPATION: writing tudor   PLOF: Independent  PATIENT GOALS: to not need the catheter for long  PERTINENT HISTORY:  See above Sexual abuse: no   BOWEL MOVEMENT: Pain with bowel movement: No Type of bowel movement:Type (Bristol Stool Scale) 4, Frequency daily, and Strain No Fully empty rectum: Yes:   Leakage: No Pads: No Fiber supplement: No  URINATION: Pain with urination: No Fully empty bladder: Yes:   Stream: Strong Urgency: Yes:   Frequency: around 2-3 hours;  nightly 4-5x Leakage:  rarely but could have leakage  Pads:  No  INTERCOURSE: Pain with intercourse:  not active Climax: not active    OBJECTIVE:   DIAGNOSTIC FINDINGS:    PATIENT SURVEYS:    PFIQ-7 UIQ-7 =14  COGNITION: Overall cognitive status: Within functional limits for tasks assessed     SENSATION: Light touch: Appears intact Proprioception: Appears intact  MUSCLE LENGTH: Bil hamstrings and adductors limited by 50% Lt>Rt.    FUNCTIONAL TESTS:  5 times sit to stand: 21s Five times Sit to Stand Test (FTSS) Method: Use a straight back chair with a solid seat that is 16-18" high. Ask participant to sit on the chair with arms folded across their chest.   Instructions: "Stand up and sit down as quickly as possible 5 times, keeping your arms folded across your chest."   Measurement: Stop timing when the participant stands the 5th time  Times > 13.6 seconds is associated with increased disability and morbidity (Guralnik, 2000) Times > 15 seconds is predictive of recurrent falls in healthy individuals aged 52 and older (Buatois, et al., 2008) Normal performance values in community dwelling individuals aged 80 and older (Bohannon, 2006): 60-69 years: 11.4 seconds 70-79 years: 12.6 seconds 80-89 years: 14.8 seconds   GAIT: Distance walked: 150' Assistive device utilized: Single point cane Level of assistance: Modified independence Comments: decreased cadence, decreased stride length, decreased bil step height but worse on Lt side, forward flexed posture and head carriage  POSTURE: rounded shoulders, forward head, and posterior pelvic tilt  PELVIC ALIGNMENT: Lt anterior rotation  LUMBARAROM/PROM:  A/PROM A/PROM  eval  Flexion Limited by 50%  Extension WFL  Right lateral flexion Limited by 75%  Left lateral flexion Limited by 75%  Right rotation Limited by 50%  Left rotation Limited by 50%   (Blank rows = not tested)  LOWER EXTREMITY AROM/PROM:  [facility]  LOWER EXTREMITY MMT:  Lt hip grossly 3+/5; Rt hip grossly  4/5; knees 4+/5  PALPATION: GENERAL Tightness noted in lumbar and thoracic paraspinals and gluteals, TTP  at anterior Lt hip slightly and bil piriformis              External Perineal Exam deferred               Internal Pelvic Floor deferred  Patient confirms identification and approves PT to assess internal pelvic floor and treatment No  PELVIC MMT:   MMT eval  Internal Anal Sphincter   External Anal Sphincter   Puborectalis   Diastasis Recti   (Blank rows = not tested)  TONE: Deferred   TODAY'S TREATMENT:                                                                                                                              DATE:   12/03/22 EVAL Examination completed, findings reviewed, pt educated on POC, HEP. Pt motivated to participate in PT and agreeable to attempt recommendations.    If treatment provided at initial evaluation, no treatment charged due to lack of authorization.       PATIENT EDUCATION:  Education details: [id number] Person educated: Patient Education method: Explanation, Demonstration, Tactile cues, Verbal cues, and Handouts Education comprehension: verbalized understanding and returned demonstration  HOME EXERCISE PROGRAM: [id number]  ASSESSMENT:  CLINICAL IMPRESSION: Patient is a 52 y.o. male  who was seen today for physical therapy evaluation and treatment for pre assessment for current prostate cancer medical treatment plan. Pt has RADICAL PROSTATECTOMY 12/16/22 and presents today to assess baseline physical levels and limitations. Pt found to have to weakness in bil LE with Lt>Rt (Lt hip replacement 10/07/32 with no PT per pt). Pt also demonstrated impaired gait mechanics and posture, decreased stability in single leg stance for 8s each. Pt had high fall risk score with 5XSTS time. Has had history of falls after surgery but not since then. Pt tolerated well without increased pain at hip and does report increased urinary frequency and increased  urgency. Pt educated on urge drill, HEP, and bladder retraining post surgery once catheter removed. Pt would benefit from additional PT to further address deficits.     OBJECTIVE IMPAIRMENTS: decreased activity tolerance, decreased balance, decreased mobility, difficulty walking, decreased strength, increased fascial restrictions, increased muscle spasms, impaired flexibility, improper body mechanics, postural dysfunction, and pain.   ACTIVITY LIMITATIONS: continence  PARTICIPATION LIMITATIONS: community activity, occupation, and yard work  PERSONAL FACTORS: Time since onset of injury/illness/exacerbation and 1 comorbidity: medical history  are also affecting patient's functional outcome.   REHAB POTENTIAL: Good  CLINICAL DECISION MAKING: Evolving/moderate complexity  EVALUATION COMPLEXITY: Moderate   GOALS: Goals reviewed with patient? Yes  SHORT TERM GOALS: Target date: 12/31/22  Pt to be I with HEP.  Baseline: Goal status: INITIAL  2.  Pt to report improved urinary frequency to no sooner than every 2 hours at least 50% of the time for bladder training after surgery.  Baseline:  Goal status: INITIAL  3.  Pt to demonstrate at least 4/5 bil hip strength for improved  pelvic stability and gait mechanics for ambulating to/from bathroom safely..  Baseline:  Goal status: INITIAL   LONG TERM GOALS: Target date: 04/04/23  Pt to be I with advanced HEP.  Baseline:  Goal status: INITIAL  2.  Pt to report improved time between bladder voids to at least 3 hours for improved QOL with decreased urinary frequency at least 75% of the time.   Baseline:  Goal status: INITIAL  3.  Pt to demonstrate at least 4/5 pelvic floor strength for improved pelvic stability and decreased strain at pelvic floor/ decrease leakage.  Baseline:  Goal status: INITIAL  4.  Pt will be independent with the knack, urge suppression technique, and double voiding in order to improve bladder habits and decrease  urinary incontinence.   Baseline:  Goal status: INITIAL  5.  Pt to demonstrate improved coordination of pelvic floor and breathing mechanics with 10# squat with appropriate synergistic patterns to decrease pain and leakage at least 75% of the time.    Baseline:  Goal status: INITIAL  6.  Pt will be able to functional actions such as walking 30 mins without leakage for improved QOL and improved tolerance to activity and bladder habits Baseline:  Goal status: INITIAL   PLAN:  PT FREQUENCY: 1x/week  PT DURATION:  8 sessions  PLANNED INTERVENTIONS: Therapeutic exercises, Therapeutic activity, Neuromuscular re-education, Balance training, Gait training, Patient/Family education, Self Care, Joint mobilization, scar mobilization, Taping, Vasopneumatic device, Biofeedback, Manual therapy, and Re-evaluation  PLAN FOR NEXT SESSION: internal if needed and pt consents, breathing mechanics, core and hip strengthening, coordination of pelvic floor and breathing and activity, urge suppression, bladder retraining, knack   Otelia Sergeant, PT, DPT 09/24/244:39 PM

## 2022-12-03 NOTE — Patient Instructions (Signed)

## 2022-12-05 ENCOUNTER — Encounter (HOSPITAL_COMMUNITY): Payer: Self-pay

## 2022-12-05 NOTE — Progress Notes (Addendum)
PCP - Effie Shy ,MD Cardiologist - no    PPM/ICD -  Device Orders -  Rep Notified -   Chest x-ray -  EKG - 09-14-22 epic Stress Test -  ECHO -  Cardiac Cath -   Sleep Study -  CPAP -   Fasting Blood Sugar -  Checks Blood Sugar _____ times a day  Blood Thinner Instructions: Aspirin Instructions:  ERAS Protcol - PRE-SURGERY    COVID vaccine -yes  Activity--Able to complete ADL's without CP some SOB not new for pt. Did a lot of Walking after hip replacement in October 08 2022 Anesthesia review: HTN, asthma,   Patient denies shortness of breath, fever, cough and chest pain at PAT appointment   All instructions explained to the patient, with a verbal understanding of the material. Patient agrees to go over the instructions while at home for a better understanding. Patient also instructed to self quarantine after being tested for COVID-19. The opportunity to ask questions was provided.

## 2022-12-05 NOTE — Patient Instructions (Addendum)
SURGICAL WAITING ROOM VISITATION  Patients having surgery or a procedure may have no more than 2 support people in the waiting area - these visitors may rotate.    Children under the age of 7 must have an adult with them who is not the patient.  If the patient needs to stay at the hospital during part of their recovery, the visitor guidelines for inpatient rooms apply. Pre-op nurse will coordinate an appropriate time for 1 support person to accompany patient in pre-op.  This support person may not rotate.    Please refer to the [facility] website for the visitor guidelines for Inpatients (after your surgery is over and you are in a regular room).       Your procedure is scheduled on:    12-16-22   Report to [facility] Main Entrance    Report to admitting at      0900  AM   Call this number if you have problems the morning of surgery [phone]   FOLLOW A CLEAR LIQUID DIET THE DAY BEFORE SURGERY              DRINK ONE 8 OZ BOTTLE OF MAGNESIUM CITRATE BY NOON THE DAY BEFORE SURGERY             ONE FLEETS ENEMA THE NIGHT BEFORE SURGERY   Water Non-Citrus Juices (without pulp, NO RED-Apple, White grape, White cranberry) Black Coffee (NO MILK/CREAM OR CREAMERS, sugar ok)  Clear Tea (NO MILK/CREAM OR CREAMERS, sugar ok) regular and decaf                             Plain Jell-O (NO RED)                                           Fruit ices (not with fruit pulp, NO RED)                                     Popsicles (NO RED)                                                               Sports drinks like Gatorade (NO RED)                         If you have questions, please contact your surgeon's office.   FOLLOW BOWEL PREP AND ANY ADDITIONAL PRE OP INSTRUCTIONS YOU RECEIVED FROM YOUR SURGEON'S OFFICE!!!     Oral Hygiene is also important to reduce your risk of infection.                                    Remember - BRUSH YOUR TEETH THE MORNING OF SURGERY WITH YOUR  REGULAR TOOTHPASTE  DENTURES WILL BE REMOVED PRIOR TO SURGERY PLEASE DO NOT APPLY "Poly grip" OR ADHESIVES!!!   Do NOT smoke after Midnight   Stop all vitamins and herbal supplements 7 days before  surgery.   Take these medicines the morning of surgery with A SIP OF WATER: rosuvastatin, Hydrocodone if needed, gabapentin  DO NOT TAKE ANY ORAL DIABETIC MEDICATIONS DAY OF YOUR SURGERY  Bring CPAP mask and tubing day of surgery.                              You may not have any metal on your body including hair pins, jewelry, and body piercing             Do not wear lotions, powders, /cologne, or deodorant                 Men may shave face and neck.   Do not bring valuables to the hospital. [facility] IS NOT             RESPONSIBLE   FOR VALUABLES.   Contacts, glasses, dentures or bridgework may not be worn into surgery.   Bring small overnight bag day of surgery.   DO NOT BRING YOUR HOME MEDICATIONS TO THE HOSPITAL. PHARMACY WILL DISPENSE MEDICATIONS LISTED ON YOUR MEDICATION LIST TO YOU DURING YOUR ADMISSION IN THE HOSPITAL!                  Please read over the following fact sheets you were given: IF YOU HAVE QUESTIONS ABOUT YOUR PRE-OP INSTRUCTIONS PLEASE CALL [phone]    If you test positive for Covid or have been in contact with anyone that has tested positive in the last 10 days please notify you surgeon.    [facility] - Preparing for Surgery Before surgery, you can play an important role.  Because skin is not sterile, your skin needs to be as free of germs as possible.  You can reduce the number of germs on your skin by washing with CHG (chlorahexidine gluconate) soap before surgery.  CHG is an antiseptic cleaner which kills germs and bonds with the skin to continue killing germs even after washing. Please DO NOT use if you have an allergy to CHG or antibacterial soaps.  If your skin becomes reddened/irritated stop using the CHG and inform your nurse when you  arrive at Short Stay. Do not shave (including legs and underarms) for at least 48 hours prior to the first CHG shower.  You may shave your face/neck. Please follow these instructions carefully:  1.  Shower with CHG Soap the night before surgery and the  morning of Surgery.  2.  If you choose to wash your hair, wash your hair first as usual with your  normal  shampoo.  3.  After you shampoo, rinse your hair and body thoroughly to remove the  shampoo.                           4.  Use CHG as you would any other liquid soap.  You can apply chg directly  to the skin and wash                       Gently with a scrungie or clean washcloth.  5.  Apply the CHG Soap to your body ONLY FROM THE NECK DOWN.   Do not use on face/ open                           Wound or open  sores. Avoid contact with eyes, ears mouth and genitals (private parts).                       Wash face,  Genitals (private parts) with your normal soap.             6.  Wash thoroughly, paying special attention to the area where your surgery  will be performed.  7.  Thoroughly rinse your body with warm water from the neck down.  8.  DO NOT shower/wash with your normal soap after using and rinsing off  the CHG Soap.                9.  Pat yourself dry with a clean towel.            10.  Wear clean pajamas.            11.  Place clean sheets on your bed the night of your first shower and do not  sleep with pets. Day of Surgery : Do not apply any lotions/deodorants the morning of surgery.  Please wear clean clothes to the hospital/surgery center.  FAILURE TO FOLLOW THESE INSTRUCTIONS MAY RESULT IN THE CANCELLATION OF YOUR SURGERY PATIENT SIGNATURE_________________________________  NURSE SIGNATURE__________________________________  ________________________________________________________________________

## 2022-12-11 ENCOUNTER — Other Ambulatory Visit: Payer: Self-pay

## 2022-12-11 ENCOUNTER — Encounter (HOSPITAL_COMMUNITY): Payer: Self-pay

## 2022-12-11 ENCOUNTER — Encounter (HOSPITAL_COMMUNITY)
Admission: RE | Admit: 2022-12-11 | Discharge: 2022-12-11 | Disposition: A | Discharge status: Home / Self Care | Payer: Medicaid Other | Source: Ambulatory Visit | Attending: Urology | Admitting: Urology

## 2022-12-11 VITALS — BP 151/97 | HR 69 | Temp 97.9°F | Resp 16 | Ht 66.0 in | Wt 165.0 lb

## 2022-12-11 DIAGNOSIS — R7303 Prediabetes: Secondary | ICD-10-CM | POA: Diagnosis not present

## 2022-12-11 DIAGNOSIS — Z01812 Encounter for preprocedural laboratory examination: Secondary | ICD-10-CM | POA: Insufficient documentation

## 2022-12-11 DIAGNOSIS — Z01818 Encounter for other preprocedural examination: Secondary | ICD-10-CM

## 2022-12-11 DIAGNOSIS — I10 Essential (primary) hypertension: Secondary | ICD-10-CM | POA: Insufficient documentation

## 2022-12-11 HISTORY — DX: Chronic obstructive pulmonary disease, unspecified: J44.9

## 2022-12-11 LAB — SURGICAL PCR SCREEN
MRSA, PCR: NEGATIVE
Staphylococcus aureus: NEGATIVE

## 2022-12-11 LAB — HEMOGLOBIN A1C
Hgb A1c MFr Bld: 5.1 % (ref 4.8–5.6)
Mean Plasma Glucose: 99.67 mg/dL

## 2022-12-11 LAB — BASIC METABOLIC PANEL
Anion gap: 8 (ref 5–15)
BUN: 15 mg/dL (ref 6–20)
CO2: 23 mmol/L (ref 22–32)
Calcium: 9.2 mg/dL (ref 8.9–10.3)
Chloride: 103 mmol/L (ref 98–111)
Creatinine, Ser: 0.83 mg/dL (ref 0.61–1.24)
GFR, Estimated: >60 mL/min (ref >60)
Glucose, Bld: 100 mg/dL — ABNORMAL HIGH (ref 70–99)
Potassium: 4.5 mmol/L (ref 3.5–5.1)
Sodium: 134 mmol/L — ABNORMAL LOW (ref 135–145)

## 2022-12-11 LAB — CBC
HCT: 47.7 % (ref 39.0–52.0)
Hemoglobin: 15.3 g/dL (ref 13.0–17.0)
MCH: 26.7 pg (ref 26.0–34.0)
MCHC: 32.1 g/dL (ref 30.0–36.0)
MCV: 83.4 fL (ref 80.0–100.0)
Platelets: 242 10*3/uL (ref 150–400)
RBC: 5.72 MIL/uL (ref 4.22–5.81)
RDW: 14.6 % (ref 11.5–15.5)
WBC: 6.5 10*3/uL (ref 4.0–10.5)
nRBC: 0 % (ref 0.0–0.2)

## 2022-12-13 NOTE — H&P (Signed)
Office Visit Report     11/26/2022   --------------------------------------------------------------------------------   Edward Kelley  MRN: [id number]  DOB: 01-09-1971, 52 year old Male  SSN: [id number]   PRIMARY CARE:     REFERRING:  Effie Shy, MD  PROVIDER:  Kasandra Knudsen, M.D.  TREATING:  Ulyses Amor, [place]  LOCATION:  [facility] [phone]     --------------------------------------------------------------------------------   CC/HPI: Pt presents today for pre-operative history and physical exam in anticipation of robotic assisted lap radical prostatectomy with bilateral pelvic lymph node dissection by Dr. Laverle Patter on 12/16/22. He complains of mild dysuria. He drinks a lot of caffeine and not much water.   Pt denies F/C, HA, CP, SOB, N/V, diarrhea/constipation, back pain, flank pain, hematuria, and dysuria.     HX:   CC: Prostate Cancer   Physician requesting consult: Dr. Kasandra Knudsen  PCP: Dr. Effie Shy   Edward Kelley is a 52 year old gentleman who was noted to have a PSA of 28 and bilateral prostate nodules. This prompted a TRUS biopsy of the prostate by Dr. Arita Miss on 08/09/22 that demonstrated Gleason 4+3=7 adenocarcinoma with 3 out of12 biopsy cores positive all on the left side.   Family history: His mother died of metastatic breast cancer at age 50.   Imaging studies: PSMA PET scan (09/13/2022): No evidence of metastatic disease. Incidentally, he was noted to have multiple small bilateral pulmonary nodules that were subcentimeter. He was also noted to have an asymmetric area of radiotracer avid uptake at the medial left base of the tongue that had corresponded with an ill-defined soft tissue small mass on CT imaging that was favored to reflect ectopic glandular tissue of the submandibular gland. There was no evidence to suggest metastatic disease.   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 Lajoyce Corners.  PSH: Laparoscopic appendectomy in 2017 by Dr. Gaynelle Adu. He did have a repair of a small umbilical hernia at the time primarily without mesh.   TNM stage: cT2c N0 M0  PSA: 28  Gleason score: 4+3=7 (GG 3)  Biopsy (08/09/22): 3/12 cores positive  Left: L apex (5%, 3+4=7), L mid (40%, 4+3=7), L base (5%, 3+4=7)  Right: Benign  Prostate volume: 26 cc   Nomogram  OC disease: 15%  EPE: 81%  SVI: 27%  LNI: 30%  PFS (5 year, 10 year): 26%, 16%   Urinary function: IPSS is 24. He started tamsulosin has noted minimal benefit. He states that he does have longstanding lower urinary tract symptoms particularly urgency and frequency of urination since he was a younger man.  Erectile function: SHIM score is 2. He has been sexually inactive over the past few years and does not have a current partner. However, he states that he can get adequate nocturnal erections. He has not made concerted attempts to get an erection in some time.     ALLERGIES: Seasonal Allergies     MEDICATIONS: Tamsulosin Hcl 0.4 mg capsule 1 capsule PO Q HS  Tamsulosin Hcl 0.4 mg capsule 1 capsule PO Q HS  Alprazolam 0.5 mg tablet     Notes: Gabapentin  Rosuvastatin  muscle relaxer  ASA 81mg   Fish oil     GU PSH: Prostate Needle Biopsy - 08/09/2022       PSH Notes: Left hip   NON-GU PSH: Appendectomy. - 2018 Surgical Pathology, Gross And Microscopic Examination  For Prostate Needle - 08/09/2022     GU PMH: Prostate Cancer - 10/22/2022, - 08/16/2022 BPH w/LUTS - 08/16/2022, - 08/07/2022 ED due to arterial insufficiency - 08/16/2022, - 08/07/2022 Weak Urinary Stream - 08/16/2022, - 08/07/2022 Elevated PSA - 08/09/2022, - 08/07/2022 Nocturia - 08/07/2022      PMH Notes: High Cholesterol   NON-GU PMH: Anxiety Arrhythmia Arthritis Asthma GERD    FAMILY HISTORY: 1 son - Other 3 daughters - Other   SOCIAL HISTORY: Marital Status: Single Ethnicity: Not Hispanic Or  Latino; Race: White Current Smoking Status: Patient smokes occasionally. Has smoked since 07/10/1986.   Tobacco Use Assessment Completed: Used Tobacco in last 30 days? Does not use smokeless tobacco. Drinks 4 drinks per day.  Patient uses recreational drugs. Uses marijuana. Drinks 4+ caffeinated drinks per day. Has not had a blood transfusion.     Notes: Smokes 1 ppd  ETOH liquor only on weekends  Marijuana joint daily    REVIEW OF SYSTEMS:    GU Review Male:   Patient reports frequent urination, hard to postpone urination, burning/ pain with urination, get up at night to urinate, and leakage of urine. Patient denies stream starts and stops, trouble starting your stream, have to strain to urinate , erection problems, and penile pain.  Gastrointestinal (Upper):   Patient reports nausea. Patient denies vomiting and indigestion/ heartburn.  Gastrointestinal (Lower):   Patient reports diarrhea. Patient denies constipation.  Constitutional:   Patient denies fever, night sweats, weight loss, and fatigue.  Skin:   Patient denies skin rash/ lesion and itching.  Eyes:   Patient denies blurred vision and double vision.  Ears/ Nose/ Throat:   Patient denies sore throat and sinus problems.  Hematologic/Lymphatic:   Patient denies swollen glands and easy bruising.  Cardiovascular:   Patient denies leg swelling and chest pains.  Respiratory:   Patient denies cough and shortness of breath.  Endocrine:   Patient denies excessive thirst.  Musculoskeletal:   Patient denies back pain and joint pain.  Neurological:   Patient denies headaches and dizziness.  Psychologic:   Patient denies depression and anxiety.   Notes: nausea for over 3 years  diarrhea once or twice a week     VITAL SIGNS:      11/26/2022 03:26 PM  BP 142/94 mmHg  Pulse 91 /min  Temperature 97.3 F / 36.2 C   MULTI-SYSTEM PHYSICAL EXAMINATION:    Respiratory: Crackles bilateral bases  Cardiovascular: Regular rate and rhythm. No  murmur, no gallop.      Complexity of Data:  Records Review:   Previous Patient Records  Urine Test Review:   Urinalysis   11/26/22  Urinalysis  Urine Appearance Clear   Urine Color Yellow   Urine Glucose Neg mg/dL  Urine Bilirubin Neg mg/dL  Urine Ketones Neg mg/dL  Urine Specific Gravity 1.025   Urine Blood Neg ery/uL  Urine pH 6.0   Urine Protein 1+ mg/dL  Urine Urobilinogen 0.2 mg/dL  Urine Nitrites Neg   Urine Leukocyte Esterase Neg leu/uL  Urine WBC/hpf 0 - 5/hpf   Urine RBC/hpf NS (Not Seen)   Urine Epithelial Cells 0 - 5/hpf   Urine Bacteria Rare (0-9/hpf)   Urine Mucous Present   Urine Yeast NS (Not Seen)   Urine Trichomonas Not Present   Urine Cystals NS (Not Seen)   Urine Casts Granular   Urine Sperm Not Present    PROCEDURES:          Urinalysis w/Scope - 81001  Dipstick Dipstick Cont'd Micro  Color: Yellow Bilirubin: Neg mg/dL WBC/hpf: 0 - 5/hpf  Appearance: Clear Ketones: Neg mg/dL RBC/hpf: NS (Not Seen)  Specific Gravity: 1.025 Blood: Neg ery/uL Bacteria: Rare (0-9/hpf)  pH: 6.0 Protein: 1+ mg/dL Cystals: NS (Not Seen)  Glucose: Neg mg/dL Urobilinogen: 0.2 mg/dL Casts: Granular    Nitrites: Neg Trichomonas: Not Present    Leukocyte Esterase: Neg leu/uL Mucous: Present      Epithelial Cells: 0 - 5/hpf      Yeast: NS (Not Seen)      Sperm: Not Present    ASSESSMENT:      ICD-10 Details  1 GU:   Prostate Cancer - C61    PLAN:           Orders Labs Urine Culture          Schedule Return Visit/Planned Activity: Keep Scheduled Appointment - Schedule Surgery          Document Letter(s):  Created for Patient: Clinical Summary         Notes:   There are no changes in the patients history or physical exam since last evaluation by Dr. Laverle Patter. Pt is scheduled to undergo RALP with BPLND on 12/16/22.   Urine for culture.   All pt's questions were answered to the best of my ability.          Next Appointment:      Next Appointment: 12/16/2022  11:15 AM    Appointment Type: Surgery     Location: [facility] [phone]    Provider: Heloise Purpura, M.D.    Reason for Visit: WL/OBS RA LAP RAD PROSTATECTOMY LEV 2 ABD BPLND WITH RESIDENT      * Signed by Ulyses Amor, PA on 11/26/22 at 3:48 PM (EDT)*

## 2022-12-16 ENCOUNTER — Other Ambulatory Visit: Payer: Self-pay

## 2022-12-16 ENCOUNTER — Ambulatory Visit (HOSPITAL_COMMUNITY): Payer: Medicaid Other | Admitting: Anesthesiology

## 2022-12-16 ENCOUNTER — Observation Stay (HOSPITAL_COMMUNITY)
Admission: RE | Admit: 2022-12-16 | Discharge: 2022-12-17 | Disposition: A | Discharge status: Home / Self Care | Payer: Medicaid Other | Source: Ambulatory Visit | Attending: Urology | Admitting: Urology

## 2022-12-16 ENCOUNTER — Other Ambulatory Visit (HOSPITAL_COMMUNITY): Payer: Self-pay

## 2022-12-16 ENCOUNTER — Encounter (HOSPITAL_COMMUNITY): Payer: Self-pay | Admitting: Urology

## 2022-12-16 ENCOUNTER — Encounter (HOSPITAL_COMMUNITY)
Admission: RE | Disposition: A | Discharge status: Home / Self Care | Payer: Self-pay | Source: Ambulatory Visit | Attending: Urology

## 2022-12-16 ENCOUNTER — Ambulatory Visit (HOSPITAL_BASED_OUTPATIENT_CLINIC_OR_DEPARTMENT_OTHER): Payer: Medicaid Other | Admitting: Anesthesiology

## 2022-12-16 DIAGNOSIS — C61 Malignant neoplasm of prostate: Principal | ICD-10-CM | POA: Diagnosis present

## 2022-12-16 DIAGNOSIS — Z96642 Presence of left artificial hip joint: Secondary | ICD-10-CM | POA: Insufficient documentation

## 2022-12-16 DIAGNOSIS — Z87891 Personal history of nicotine dependence: Secondary | ICD-10-CM | POA: Insufficient documentation

## 2022-12-16 DIAGNOSIS — Z79899 Other long term (current) drug therapy: Secondary | ICD-10-CM | POA: Insufficient documentation

## 2022-12-16 DIAGNOSIS — J45909 Unspecified asthma, uncomplicated: Secondary | ICD-10-CM | POA: Insufficient documentation

## 2022-12-16 HISTORY — PX: ROBOT ASSISTED LAPAROSCOPIC RADICAL PROSTATECTOMY: SHX5141

## 2022-12-16 HISTORY — PX: LYMPHADENECTOMY: SHX5960

## 2022-12-16 LAB — TYPE AND SCREEN
ABO/RH(D): A NEG
Antibody Screen: NEGATIVE

## 2022-12-16 LAB — HEMOGLOBIN AND HEMATOCRIT, BLOOD
HCT: 50.1 % (ref 39.0–52.0)
Hemoglobin: 15.6 g/dL (ref 13.0–17.0)

## 2022-12-16 SURGERY — XI ROBOTIC ASSISTED LAPAROSCOPIC RADICAL PROSTATECTOMY LEVEL 2
Anesthesia: General

## 2022-12-16 MED ORDER — TRIPLE ANTIBIOTIC 3.5-400-5000 EX OINT: 1.0000 | TOPICAL_OINTMENT | Freq: Three times a day (TID) | CUTANEOUS | Status: DC | PRN

## 2022-12-16 MED ORDER — HEPARIN SODIUM (PORCINE) 5000 UNIT/ML IJ SOLN
INTRAMUSCULAR | Status: AC
  Filled 2022-12-16: qty 1

## 2022-12-16 MED ORDER — ROCURONIUM BROMIDE 100 MG/10ML IV SOLN
INTRAVENOUS | Status: DC | PRN
  Administered 2022-12-16: 100 mg via INTRAVENOUS

## 2022-12-16 MED ORDER — STERILE WATER FOR IRRIGATION IR SOLN
Status: DC | PRN
  Administered 2022-12-16: 1000 mL

## 2022-12-16 MED ORDER — PROPOFOL 10 MG/ML IV BOLUS
INTRAVENOUS | Status: AC
  Filled 2022-12-16: qty 20

## 2022-12-16 MED ORDER — ONDANSETRON HCL 4 MG/2ML IJ SOLN: 4.0000 mg | Freq: Once | INTRAMUSCULAR | Status: DC | PRN

## 2022-12-16 MED ORDER — SODIUM CHLORIDE 0.9 % IV BOLUS: 1000.0000 mL | Freq: Once | INTRAVENOUS | Status: DC

## 2022-12-16 MED ORDER — LIDOCAINE HCL (PF) 2 % IJ SOLN
INTRAMUSCULAR | Status: AC
  Filled 2022-12-16: qty 5

## 2022-12-16 MED ORDER — CHLORHEXIDINE GLUCONATE 0.12 % MT SOLN: 15.0000 mL | Freq: Once | OROMUCOSAL | Status: DC

## 2022-12-16 MED ORDER — DIPHENHYDRAMINE HCL 50 MG/ML IJ SOLN: 12.5000 mg | Freq: Four times a day (QID) | INTRAMUSCULAR | Status: DC | PRN

## 2022-12-16 MED ORDER — PHENYLEPHRINE HCL-NACL 20-0.9 MG/250ML-% IV SOLN
INTRAVENOUS | Status: DC | PRN
Start: 2022-12-16 — End: 2022-12-16
  Administered 2022-12-16: 40 ug/min via INTRAVENOUS

## 2022-12-16 MED ORDER — HYDROMORPHONE HCL 1 MG/ML IJ SOLN
0.2500 mg | INTRAMUSCULAR | Status: DC | PRN
  Administered 2022-12-16 (×3): 0.5 mg via INTRAVENOUS

## 2022-12-16 MED ORDER — ACETAMINOPHEN 325 MG PO TABS: 650.0000 mg | ORAL_TABLET | ORAL | Status: DC | PRN

## 2022-12-16 MED ORDER — MIDAZOLAM HCL 5 MG/5ML IJ SOLN
INTRAMUSCULAR | Status: DC | PRN
  Administered 2022-12-16: 2 mg via INTRAVENOUS

## 2022-12-16 MED ORDER — ROSUVASTATIN CALCIUM 5 MG PO TABS
5.0000 mg | ORAL_TABLET | Freq: Every day | ORAL | Status: DC
  Administered 2022-12-17: 5 mg via ORAL
  Filled 2022-12-16: qty 1

## 2022-12-16 MED ORDER — FENTANYL CITRATE (PF) 100 MCG/2ML IJ SOLN
INTRAMUSCULAR | Status: AC
  Filled 2022-12-16: qty 2

## 2022-12-16 MED ORDER — DROPERIDOL 2.5 MG/ML IJ SOLN: 0.6250 mg | Freq: Once | INTRAMUSCULAR | Status: DC | PRN

## 2022-12-16 MED ORDER — MIDAZOLAM HCL 2 MG/2ML IJ SOLN
INTRAMUSCULAR | Status: AC
  Filled 2022-12-16: qty 2

## 2022-12-16 MED ORDER — DOCUSATE SODIUM 100 MG PO CAPS
100.0000 mg | ORAL_CAPSULE | Freq: Two times a day (BID) | ORAL | Status: DC
  Administered 2022-12-16 – 2022-12-17 (×2): 100 mg via ORAL
  Filled 2022-12-16 (×2): qty 1

## 2022-12-16 MED ORDER — PROPOFOL 10 MG/ML IV BOLUS
INTRAVENOUS | Status: DC | PRN
  Administered 2022-12-16: 150 mg via INTRAVENOUS

## 2022-12-16 MED ORDER — ORAL CARE MOUTH RINSE: 15.0000 mL | Freq: Once | OROMUCOSAL | Status: DC

## 2022-12-16 MED ORDER — KETAMINE HCL 50 MG/5ML IJ SOSY
PREFILLED_SYRINGE | INTRAMUSCULAR | Status: AC
  Filled 2022-12-16: qty 5

## 2022-12-16 MED ORDER — SULFAMETHOXAZOLE-TRIMETHOPRIM 800-160 MG PO TABS
1.0000 | ORAL_TABLET | Freq: Two times a day (BID) | ORAL | 0 refills | Status: AC
  Filled 2022-12-16: qty 6, 3d supply, fill #0

## 2022-12-16 MED ORDER — HYDROMORPHONE HCL 1 MG/ML IJ SOLN
INTRAMUSCULAR | Status: AC
  Administered 2022-12-16: 0.5 mg via INTRAVENOUS
  Filled 2022-12-16: qty 2

## 2022-12-16 MED ORDER — GABAPENTIN 300 MG PO CAPS
300.0000 mg | ORAL_CAPSULE | Freq: Three times a day (TID) | ORAL | Status: DC
  Administered 2022-12-16 – 2022-12-17 (×2): 300 mg via ORAL
  Filled 2022-12-16 (×2): qty 1

## 2022-12-16 MED ORDER — KETOROLAC TROMETHAMINE 15 MG/ML IJ SOLN
15.0000 mg | Freq: Four times a day (QID) | INTRAMUSCULAR | Status: DC
  Administered 2022-12-16 – 2022-12-17 (×4): 15 mg via INTRAVENOUS
  Filled 2022-12-16 (×4): qty 1

## 2022-12-16 MED ORDER — DEXAMETHASONE SODIUM PHOSPHATE 10 MG/ML IJ SOLN
INTRAMUSCULAR | Status: DC | PRN
Start: 2022-12-16 — End: 2022-12-16
  Administered 2022-12-16: 8 mg via INTRAVENOUS

## 2022-12-16 MED ORDER — ACETAMINOPHEN 10 MG/ML IV SOLN
INTRAVENOUS | Status: AC
  Filled 2022-12-16: qty 100

## 2022-12-16 MED ORDER — ONDANSETRON HCL 4 MG/2ML IJ SOLN: 4.0000 mg | INTRAMUSCULAR | Status: DC | PRN

## 2022-12-16 MED ORDER — ONDANSETRON HCL 4 MG/2ML IJ SOLN
INTRAMUSCULAR | Status: DC | PRN
  Administered 2022-12-16: 4 mg via INTRAVENOUS

## 2022-12-16 MED ORDER — ALPRAZOLAM 0.5 MG PO TABS
0.5000 mg | ORAL_TABLET | Freq: Two times a day (BID) | ORAL | Status: DC | PRN
  Administered 2022-12-16 – 2022-12-17 (×2): 0.5 mg via ORAL
  Filled 2022-12-16 (×2): qty 1

## 2022-12-16 MED ORDER — TIZANIDINE HCL 2 MG PO TABS: 2.0000 mg | ORAL_TABLET | Freq: Three times a day (TID) | ORAL | Status: DC | PRN

## 2022-12-16 MED ORDER — ZOLPIDEM TARTRATE 5 MG PO TABS: 5.0000 mg | ORAL_TABLET | Freq: Every evening | ORAL | Status: DC | PRN

## 2022-12-16 MED ORDER — BUPIVACAINE-EPINEPHRINE 0.25% -1:200000 IJ SOLN
INTRAMUSCULAR | Status: DC | PRN
  Administered 2022-12-16: 33 mL

## 2022-12-16 MED ORDER — HEPARIN SODIUM (PORCINE) 1000 UNIT/ML IJ SOLN
INTRAMUSCULAR | Status: AC
  Filled 2022-12-16: qty 1

## 2022-12-16 MED ORDER — LACTATED RINGERS IV SOLN
INTRAVENOUS | Status: DC | PRN
  Administered 2022-12-16: 1000 mL

## 2022-12-16 MED ORDER — KCL IN DEXTROSE-NACL 20-5-0.45 MEQ/L-%-% IV SOLN
INTRAVENOUS | Status: DC
  Administered 2022-12-16: 150 mL/h via INTRAVENOUS
  Filled 2022-12-16 (×3): qty 1000

## 2022-12-16 MED ORDER — SUGAMMADEX SODIUM 200 MG/2ML IV SOLN
INTRAVENOUS | Status: DC | PRN
Start: 2022-12-16 — End: 2022-12-16
  Administered 2022-12-16: 200 mg via INTRAVENOUS

## 2022-12-16 MED ORDER — LACTATED RINGERS IV SOLN: INTRAVENOUS | Status: DC

## 2022-12-16 MED ORDER — KETAMINE HCL 10 MG/ML IJ SOLN
INTRAMUSCULAR | Status: DC | PRN
Start: 2022-12-16 — End: 2022-12-16
  Administered 2022-12-16 (×2): 25 mg via INTRAVENOUS

## 2022-12-16 MED ORDER — MORPHINE SULFATE (PF) 2 MG/ML IV SOLN
2.0000 mg | INTRAVENOUS | Status: DC | PRN
  Administered 2022-12-16 – 2022-12-17 (×5): 2 mg via INTRAVENOUS
  Filled 2022-12-16 (×6): qty 1

## 2022-12-16 MED ORDER — HYDROMORPHONE HCL 1 MG/ML IJ SOLN
0.5000 mg | INTRAMUSCULAR | Status: DC | PRN
  Administered 2022-12-16: 0.5 mg via INTRAVENOUS

## 2022-12-16 MED ORDER — DIPHENHYDRAMINE HCL 12.5 MG/5ML PO ELIX: 12.5000 mg | ORAL_SOLUTION | Freq: Four times a day (QID) | ORAL | Status: DC | PRN

## 2022-12-16 MED ORDER — BUPIVACAINE-EPINEPHRINE 0.25% -1:200000 IJ SOLN
INTRAMUSCULAR | Status: AC
  Filled 2022-12-16: qty 1

## 2022-12-16 MED ORDER — DEXAMETHASONE SODIUM PHOSPHATE 10 MG/ML IJ SOLN
INTRAMUSCULAR | Status: AC
  Filled 2022-12-16: qty 1

## 2022-12-16 MED ORDER — FENTANYL CITRATE (PF) 100 MCG/2ML IJ SOLN
INTRAMUSCULAR | Status: DC | PRN
  Administered 2022-12-16: 100 ug via INTRAVENOUS

## 2022-12-16 MED ORDER — KCL IN DEXTROSE-NACL 20-5-0.45 MEQ/L-%-% IV SOLN
INTRAVENOUS | Status: AC
  Filled 2022-12-16: qty 1000

## 2022-12-16 MED ORDER — ACETAMINOPHEN 10 MG/ML IV SOLN
INTRAVENOUS | Status: DC | PRN
Start: 2022-12-16 — End: 2022-12-16
  Administered 2022-12-16: 1000 mg via INTRAVENOUS

## 2022-12-16 MED ORDER — HYOSCYAMINE SULFATE 0.125 MG SL SUBL
0.1250 mg | SUBLINGUAL_TABLET | Freq: Four times a day (QID) | SUBLINGUAL | Status: DC | PRN
  Administered 2022-12-17: 0.125 mg via SUBLINGUAL
  Filled 2022-12-16: qty 1

## 2022-12-16 MED ORDER — ONDANSETRON HCL 4 MG/2ML IJ SOLN
INTRAMUSCULAR | Status: AC
  Filled 2022-12-16: qty 2

## 2022-12-16 MED ORDER — OXYCODONE HCL 5 MG PO TABS: 5.0000 mg | ORAL_TABLET | Freq: Once | ORAL | Status: DC | PRN

## 2022-12-16 MED ORDER — CEFAZOLIN SODIUM-DEXTROSE 1-4 GM/50ML-% IV SOLN
1.0000 g | Freq: Three times a day (TID) | INTRAVENOUS | Status: AC
  Administered 2022-12-16 – 2022-12-17 (×2): 1 g via INTRAVENOUS
  Filled 2022-12-16 (×2): qty 50

## 2022-12-16 MED ORDER — OXYCODONE HCL 5 MG/5ML PO SOLN: 5.0000 mg | Freq: Once | ORAL | Status: DC | PRN

## 2022-12-16 MED ORDER — CEFAZOLIN SODIUM-DEXTROSE 2-4 GM/100ML-% IV SOLN
2.0000 g | INTRAVENOUS | Status: AC
  Administered 2022-12-16: 2 g via INTRAVENOUS
  Filled 2022-12-16: qty 100

## 2022-12-16 MED ORDER — ROCURONIUM BROMIDE 10 MG/ML (PF) SYRINGE
PREFILLED_SYRINGE | INTRAVENOUS | Status: AC
  Filled 2022-12-16: qty 10

## 2022-12-16 MED ORDER — LIDOCAINE HCL (CARDIAC) PF 100 MG/5ML IV SOSY
PREFILLED_SYRINGE | INTRAVENOUS | Status: DC | PRN
  Administered 2022-12-16: 80 mg via INTRAVENOUS

## 2022-12-16 SURGICAL SUPPLY — 68 items
ADH SKN CLS APL DERMABOND .7 (GAUZE/BANDAGES/DRESSINGS) ×2
APL PRP STRL LF DISP 70% ISPRP (MISCELLANEOUS) ×2
APL SWBSTK 6 STRL LF DISP (MISCELLANEOUS) ×2
APPLICATOR COTTON TIP 6 STRL (MISCELLANEOUS) ×2 IMPLANT
APPLICATOR COTTON TIP 6IN STRL (MISCELLANEOUS) ×2
BAG COUNTER SPONGE SURGICOUNT (BAG) IMPLANT
BAG SPNG CNTER NS LX DISP (BAG)
CATH FOLEY 2WAY SLVR 18FR 30CC (CATHETERS) ×2 IMPLANT
CATH ROBINSON RED A/P 16FR (CATHETERS) ×2 IMPLANT
CATH ROBINSON RED A/P 8FR (CATHETERS) ×2 IMPLANT
CATH TIEMANN FOLEY 18FR 5CC (CATHETERS) ×2 IMPLANT
CHLORAPREP W/TINT 26 (MISCELLANEOUS) ×2 IMPLANT
CLIP LIGATING HEM O LOK PURPLE (MISCELLANEOUS) ×2 IMPLANT
COVER SURGICAL LIGHT HANDLE (MISCELLANEOUS) ×2 IMPLANT
COVER TIP SHEARS 8 DVNC (MISCELLANEOUS) ×2 IMPLANT
CUTTER ECHEON FLEX ENDO 45 340 (ENDOMECHANICALS) ×2 IMPLANT
DERMABOND ADVANCED .7 DNX12 (GAUZE/BANDAGES/DRESSINGS) ×2 IMPLANT
DRAIN CHANNEL RND F F (WOUND CARE) IMPLANT
DRAPE ARM DVNC X/XI (DISPOSABLE) ×8 IMPLANT
DRAPE COLUMN DVNC XI (DISPOSABLE) ×2 IMPLANT
DRAPE SURG IRRIG POUCH 19X23 (DRAPES) ×2 IMPLANT
DRIVER NDL LRG 8 DVNC XI (INSTRUMENTS) ×4 IMPLANT
DRIVER NDLE LRG 8 DVNC XI (INSTRUMENTS) ×4
DRSG TEGADERM 4X4.75 (GAUZE/BANDAGES/DRESSINGS) ×2 IMPLANT
ELECT PENCIL ROCKER SW 15FT (MISCELLANEOUS) ×2 IMPLANT
ELECT REM PT RETURN 15FT ADLT (MISCELLANEOUS) ×2 IMPLANT
FORCEPS BPLR LNG DVNC XI (INSTRUMENTS) ×2 IMPLANT
FORCEPS PROGRASP DVNC XI (FORCEP) ×2 IMPLANT
GAUZE SPONGE 4X4 12PLY STRL (GAUZE/BANDAGES/DRESSINGS) ×2 IMPLANT
GLOVE BIO SURGEON STRL SZ 6.5 (GLOVE) ×2 IMPLANT
GLOVE SURG LX STRL 7.5 STRW (GLOVE) ×4 IMPLANT
GOWN STRL REUS W/ TWL XL LVL3 (GOWN DISPOSABLE) ×4 IMPLANT
GOWN STRL REUS W/TWL XL LVL3 (GOWN DISPOSABLE) ×4
GOWN STRL SURGICAL XL XLNG (GOWN DISPOSABLE) ×2 IMPLANT
HOLDER FOLEY CATH W/STRAP (MISCELLANEOUS) ×2 IMPLANT
IRRIG SUCT STRYKERFLOW 2 WTIP (MISCELLANEOUS) ×2
IRRIGATION SUCT STRKRFLW 2 WTP (MISCELLANEOUS) ×2 IMPLANT
IV LACTATED RINGERS 1000ML (IV SOLUTION) ×2 IMPLANT
KIT TURNOVER KIT A (KITS) IMPLANT
NDL SAFETY ECLIP 18X1.5 (MISCELLANEOUS) IMPLANT
PACK ROBOT UROLOGY CUSTOM (CUSTOM PROCEDURE TRAY) ×2 IMPLANT
PLUG CATH AND CAP STRL 200 (CATHETERS) ×2 IMPLANT
RELOAD STAPLE 45 4.1 GRN THCK (STAPLE) ×2 IMPLANT
SCISSORS MNPLR CVD DVNC XI (INSTRUMENTS) ×2 IMPLANT
SEAL UNIV 5-12 XI (MISCELLANEOUS) ×8 IMPLANT
SET CYSTO W/LG BORE CLAMP LF (SET/KITS/TRAYS/PACK) IMPLANT
SET TUBE SMOKE EVAC HIGH FLOW (TUBING) ×2 IMPLANT
SOL ELECTROSURG ANTI STICK (MISCELLANEOUS) ×2
SOL PREP POV-IOD 4OZ 10% (MISCELLANEOUS) ×2 IMPLANT
SOLUTION ELECTROSURG ANTI STCK (MISCELLANEOUS) ×2 IMPLANT
SPIKE FLUID TRANSFER (MISCELLANEOUS) ×2 IMPLANT
STAPLE RELOAD 45 GRN (STAPLE) ×2
STAPLER POWER ECHELON 45 WIDE (STAPLE) IMPLANT
SUT ETHILON 3 0 PS 1 (SUTURE) ×2 IMPLANT
SUT MNCRL 3 0 RB1 (SUTURE) ×2 IMPLANT
SUT MNCRL 3 0 VIOLET RB1 (SUTURE) ×2 IMPLANT
SUT MNCRL AB 4-0 PS2 18 (SUTURE) ×4 IMPLANT
SUT PDS PLUS AB 0 CT-2 (SUTURE) ×4 IMPLANT
SUT VIC AB 0 CT1 27 (SUTURE) ×4
SUT VIC AB 0 CT1 27XBRD ANTBC (SUTURE) ×4 IMPLANT
SUT VIC AB 2-0 SH 27 (SUTURE) ×2
SUT VIC AB 2-0 SH 27X BRD (SUTURE) ×2 IMPLANT
SUT VIC AB 3-0 SH 27 (SUTURE)
SUT VIC AB 3-0 SH 27X BRD (SUTURE) IMPLANT
SYR 27GX1/2 1ML LL SAFETY (SYRINGE) ×2 IMPLANT
TOWEL OR NON WOVEN STRL DISP B (DISPOSABLE) ×2 IMPLANT
TROCAR Z THREAD OPTICAL 12X100 (TROCAR) IMPLANT
WATER STERILE IRR 1000ML POUR (IV SOLUTION) ×2 IMPLANT

## 2022-12-16 NOTE — Discharge Instructions (Signed)
Activity:  You are encouraged to ambulate frequently (about every hour during waking hours) to help prevent blood clots from forming in your legs or lungs.  However, you should not engage in any heavy lifting (> 10-15 lbs), strenuous activity, or straining. Diet: You should continue a clear liquid diet until passing gas from below.  Once this occurs, you may advance your diet to a soft diet that would be easy to digest (i.e soups, scrambled eggs, mashed potatoes, etc.) for 24 hours just as you would if getting over a bad stomach flu.  If tolerating this diet well for 24 hours, you may then begin eating regular food.  It will be normal to have some amount of bloating, nausea, and abdominal discomfort intermittently. Prescriptions:  You will be provided a prescription for pain medication to take as needed.  If your pain is not severe enough to require the prescription pain medication, you may take extra strength Tylenol instead.  You should also take an over the counter stool softener (Colace 100 mg twice daily) to avoid straining with bowel movements as the pain medication may constipate you. Finally, you will also be provided a prescription for an antibiotic to begin the day prior to your return visit in the office for catheter removal. Catheter care: You will be taught how to take care of the catheter by the nursing staff prior to discharge from the hospital.  You may use both a leg bag and the larger bedside bag but it is recommended to at least use the bigger bedside bag at nighttime as the leg bag is small and will fill up overnight and also does not drain as well when lying flat. You may periodically feel a strong urge to void with the catheter in place.  This is a bladder spasm and most often can occur when having a bowel movement or when you are moving around. It is typically self-limited and usually will stop after a few minutes.  You may use some Vaseline or Neosporin around the tip of the catheter to  reduce friction at the tip of the penis. Incisions:  You may start showering (not soaking or bathing in water) 48 hours after surgery and the incisions simply need to be patted dry after the shower.  No additional care is needed. What to call us about: You should call the office ([phone]) if you develop fever > 101, persistent vomiting, or the catheter stops draining. Also, feel free to call with any other questions you may have and remember the handout that was provided to you as a reference preoperatively which answers many of the common questions that arise after surgery.  

## 2022-12-16 NOTE — Anesthesia Procedure Notes (Signed)
Procedure Name: Intubation Date/Time: 12/16/2022 11:40 AM  Performed by: Vanessa Ringgold, CRNAPre-anesthesia Checklist: Patient identified, Emergency Drugs available, Suction available and Patient being monitored Patient Re-evaluated:Patient Re-evaluated prior to induction Oxygen Delivery Method: Circle system utilized Preoxygenation: Pre-oxygenation with 100% oxygen Induction Type: IV induction Ventilation: Mask ventilation without difficulty Laryngoscope Size: 2 and Miller Grade View: Grade I Tube type: Oral Tube size: 7.5 mm Number of attempts: 1 Airway Equipment and Method: Stylet Placement Confirmation: ETT inserted through vocal cords under direct vision, positive ETCO2 and breath sounds checked- equal and bilateral Secured at: 21 cm Tube secured with: Tape Dental Injury: Teeth and Oropharynx as per pre-operative assessment

## 2022-12-16 NOTE — Interval H&P Note (Signed)
History and Physical Interval Note:  12/16/2022 10:40 AM  Edward Kelley.  has presented today for surgery, with the diagnosis of PROSTATE CANCER.  The various methods of treatment have been discussed with the patient and family. After consideration of risks, benefits and other options for treatment, the patient has consented to  Procedure(s): XI ROBOTIC ASSISTED LAPAROSCOPIC RADICAL PROSTATECTOMY LEVEL 2 (N/A) BILATERAL PELVIC LYMPHADENECTOMY (Bilateral) as a surgical intervention.  The patient's history has been reviewed, patient examined, no change in status, stable for surgery.  I have reviewed the patient's chart and labs.  Questions were answered to the patient's satisfaction.     [name]

## 2022-12-16 NOTE — Plan of Care (Signed)
  Problem: Education: Goal: Knowledge of the prescribed therapeutic regimen will improve Outcome: Progressing   Problem: Activity: Goal: Ability to avoid complications of mobility impairment will improve Outcome: Progressing Goal: Ability to tolerate increased activity will improve Outcome: Progressing   Problem: Pain Management: Goal: Pain level will decrease with appropriate interventions Outcome: Progressing   

## 2022-12-16 NOTE — Transfer of Care (Signed)
Immediate Anesthesia Transfer of Care Note  Patient: Edward Kelley.  Procedure(s) Performed: XI ROBOTIC ASSISTED LAPAROSCOPIC RADICAL PROSTATECTOMY LEVEL 2 BILATERAL PELVIC LYMPHADENECTOMY (Bilateral)  Patient Location: PACU  Anesthesia Type:General  Level of Consciousness: drowsy  Airway & Oxygen Therapy: Patient Spontanous Breathing and Patient connected to nasal cannula oxygen  Post-op Assessment: Report given to RN and Post -op Vital signs reviewed and stable  Post vital signs: Reviewed and stable  Last Vitals:  Vitals Value Taken Time  BP    Temp    Pulse    Resp    SpO2      Last Pain:  Vitals:   12/16/22 0941  TempSrc:   PainSc: 0-No pain      Patients Stated Pain Goal: 4 (12/16/22 0941)  Complications: No notable events documented.

## 2022-12-16 NOTE — Anesthesia Postprocedure Evaluation (Signed)
Anesthesia Post Note  Patient: Kenyatta Keidel.  Procedure(s) Performed: XI ROBOTIC ASSISTED LAPAROSCOPIC RADICAL PROSTATECTOMY LEVEL 2 BILATERAL PELVIC LYMPHADENECTOMY (Bilateral)     Patient location during evaluation: PACU Anesthesia Type: General Level of consciousness: awake and alert and oriented Pain management: pain level controlled Vital Signs Assessment: post-procedure vital signs reviewed and stable Respiratory status: spontaneous breathing, nonlabored ventilation and respiratory function stable Cardiovascular status: blood pressure returned to baseline and stable Postop Assessment: no apparent nausea or vomiting Anesthetic complications: no  No notable events documented.  Last Vitals:  Vitals:   12/16/22 1515 12/16/22 1530  BP: (!) 144/112 (!) 162/101  Pulse:    Resp:    Temp:    SpO2:      Last Pain:  Vitals:   12/16/22 1541  TempSrc:   PainSc: 8                  Bernardino Dowell A.

## 2022-12-16 NOTE — Anesthesia Preprocedure Evaluation (Addendum)
Anesthesia Evaluation  Patient identified by MRN, date of birth, ID band Patient awake    Reviewed: Allergy & Precautions, NPO status , Patient's Chart, lab work & pertinent test results, reviewed documented beta blocker date and time   Airway Mallampati: II       Dental no notable dental hx. (+) Dental Advisory Given, Caps   Pulmonary asthma , COPD,  COPD inhaler, Current Smoker and Patient abstained from smoking.   Pulmonary exam normal breath sounds clear to auscultation       Cardiovascular hypertension, Pt. on medications Normal cardiovascular exam Rhythm:Regular Rate:Normal     Neuro/Psych  Headaches  Anxiety      Neuromuscular disease    GI/Hepatic Neg liver ROS,GERD  Medicated,,  Endo/Other  Hyperlipidemia  Renal/GU negative Renal ROS   Prostate Ca    Musculoskeletal  (+) Arthritis , Osteoarthritis,    Abdominal   Peds  Hematology negative hematology ROS (+)   Anesthesia Other Findings   Reproductive/Obstetrics                              Anesthesia Physical Anesthesia Plan  ASA: 2  Anesthesia Plan: General   Post-op Pain Management: Dilaudid IV, Precedex and Ofirmev IV (intra-op)*   Induction: Intravenous  PONV Risk Score and Plan: 3 and Treatment may vary due to age or medical condition, Midazolam, Ondansetron and Dexamethasone  Airway Management Planned: Oral ETT  Additional Equipment: None  Intra-op Plan:   Post-operative Plan: Extubation in OR  Informed Consent: I have reviewed the patients History and Physical, chart, labs and discussed the procedure including the risks, benefits and alternatives for the proposed anesthesia with the patient or authorized representative who has indicated his/her understanding and acceptance.     Dental advisory given  Plan Discussed with: Anesthesiologist and CRNA  Anesthesia Plan Comments:          Anesthesia  Quick Evaluation

## 2022-12-16 NOTE — Progress Notes (Signed)
Patient ID: Edward Bennis., male   DOB: Mar 11, 1971, 52 y.o.   MRN: [id number]  Post-op note  Subjective: The patient is doing well.  No complaints.  Objective: Vital signs in last 24 hours: Temp:  [98 F (36.7 C)-98.2 F (36.8 C)] 98 F (36.7 C) (10/07 1615) Pulse Rate:  [65-85] 77 (10/07 1655) Resp:  [12-20] 14 (10/07 1655) BP: (118-180)/(81-112) 151/96 (10/07 1630) SpO2:  [96 %-100 %] 100 % (10/07 1655) Weight:  [74.8 kg-78.8 kg] 74.8 kg (10/07 0941)  Intake/Output from previous day: No intake/output data recorded. Intake/Output this shift: Total I/O In: 900 [I.V.:800; IV Piggyback:100] Out: 100 [Blood:100]  Physical Exam:  General: Alert and oriented. Abdomen: Soft, Nondistended. Incisions: Clean and dry. GU: Urine clear.  Lab Results: Recent Labs    12/16/22 1626  HGB 15.6  HCT 50.1    Assessment/Plan: POD#0   1) Continue to monitor, ambulate, IS   Edward Kelley. MD   LOS: 0 days   [name] 12/16/2022, 5:15 PM

## 2022-12-16 NOTE — Op Note (Signed)
Preoperative diagnosis: Clinically localized adenocarcinoma of the prostate (clinical stage T2b N0 M0)  Postoperative diagnosis: Clinically localized adenocarcinoma of the prostate (clinical stage T2b N0 M0)  Procedure:  Robotic assisted laparoscopic radical prostatectomy (right nerve sparing) Bilateral robotic assisted laparoscopic pelvic lymphadenectomy  Surgeon: [name]. M.D.  Resident: Dr. Cathren Harsh  An assistant was required for this surgical procedure.  The duties of the assistant included but were not limited to suctioning, passing suture, camera manipulation, retraction. This procedure would not be able to be performed without an Geophysicist/field seismologist.   Anesthesia: General  Complications: None  EBL: 100 mL  IVF:  800 mL crystalloid  Specimens: Prostate and seminal vesicles Right pelvic lymph nodes Left pelvic lymph nodes  Disposition of specimens: Pathology  Drains: 20 Fr coude catheter # 19 Blake pelvic drain  Indication: Edward Kelley. is a 52 y.o. patient with clinically localized prostate cancer.  After a thorough review of the management options for treatment of prostate cancer, he elected to proceed with surgical therapy and the above procedure(s).  We have discussed the potential benefits and risks of the procedure, side effects of the proposed treatment, the likelihood of the patient achieving the goals of the procedure, and any potential problems that might occur during the procedure or recuperation. Informed consent has been obtained.  Description of procedure:  The patient was taken to the operating room and a general anesthetic was administered. He was given preoperative antibiotics, placed in the dorsal lithotomy position, and prepped and draped in the usual sterile fashion. Next a preoperative timeout was performed. A urethral catheter was placed into the bladder and a site was selected near the umbilicus for placement of the camera port. This was  placed using a standard open Hassan technique which allowed entry into the peritoneal cavity under direct vision and without difficulty. An 8 mm port was placed and a pneumoperitoneum established. The camera was then used to inspect the abdomen and there was no evidence of any intra-abdominal injuries or other abnormalities. The remaining abdominal ports were then placed. 8 mm robotic ports were placed in the right lower quadrant, left lower quadrant, and far left lateral abdominal wall. A 5 mm port was placed in the right upper quadrant and a 12 mm port was placed in the right lateral abdominal wall for laparoscopic assistance. All ports were placed under direct vision without difficulty. The surgical cart was then docked.   Utilizing the cautery scissors, the bladder was reflected posteriorly allowing entry into the space of Retzius and identification of the endopelvic fascia and prostate. The periprostatic fat was then removed from the prostate allowing full exposure of the endopelvic fascia. The endopelvic fascia was then incised from the apex back to the base of the prostate bilaterally and the underlying levator muscle fibers were swept laterally off the prostate thereby isolating the dorsal venous complex. The dorsal vein was then stapled and divided with a 45 mm Flex Echelon stapler. Attention then turned to the bladder neck which was divided anteriorly thereby allowing entry into the bladder and exposure of the urethral catheter. The catheter balloon was deflated and the catheter was brought into the operative field and used to retract the prostate anteriorly. The posterior bladder neck was then examined and was divided allowing further dissection between the bladder and prostate posteriorly until the vasa deferentia and seminal vessels were identified. The vasa deferentia were isolated, divided, and lifted anteriorly. The seminal vesicles were dissected down to their  tips with care to control the seminal  vascular arterial blood supply. These structures were then lifted anteriorly and the space between Denonvillier's fascia and the anterior rectum was developed with a combination of sharp and blunt dissection. This isolated the vascular pedicles of the prostate.  The lateral prostatic fascia on the right side of the prostate was then sharply incised allowing release of the neurovascular bundle. The vascular pedicle of the prostate on the right side was then ligated with Weck clips between the prostate and neurovascular bundle and divided with sharp cold scissor dissection resulting in neurovascular bundle preservation. On the left side, a wide non nerve sparing dissection was performed with Weck clips used to ligate the vascular pedicle of the prostate. The neurovascular bundle on the right side was then separated off the apex of the prostate and urethra.  The urethra was then sharply transected allowing the prostate specimen to be disarticulated. The pelvis was copiously irrigated and hemostasis was ensured. There was no evidence for rectal injury.  Attention then turned to the right pelvic sidewall. The fibrofatty tissue between the external iliac vein, confluence of the iliac vessels, hypogastric artery, and Cooper's ligament was dissected free from the pelvic sidewall with care to preserve the obturator nerve. Weck clips were used for lymphostasis and hemostasis. An identical procedure was performed on the contralateral side and the lymphatic packets were removed for permanent pathologic analysis.  Attention then turned to the urethral anastomosis. A 2-0 Vicryl slip knot was placed between Denonvillier's fascia, the posterior bladder neck, and the posterior urethra to reapproximate these structures. A double-armed 3-0 Monocryl suture was then used to perform a 360 running tension-free anastomosis between the bladder neck and urethra. A new urethral catheter was then placed into the bladder and irrigated.  There were no blood clots within the bladder and the anastomosis appeared to be watertight. A #19 Blake drain was then brought through the left lateral 8 mm port site and positioned appropriately within the pelvis. It was secured to the skin with a nylon suture. The surgical cart was then undocked. The right lateral 12 mm port site was closed at the fascial level with a 0 Vicryl suture placed laparoscopically. All remaining ports were then removed under direct vision. The prostate specimen was removed intact within the Endopouch retrieval bag via the periumbilical camera port site. This fascial opening was closed with two running 0 Vicryl sutures. 0.25% Marcaine was then injected into all port sites and all incisions were reapproximated at the skin level with 4-0 Monocryl subcuticular sutures. Dermabond was applied. The patient appeared to tolerate the procedure well and without complications. The patient was able to be extubated and transferred to the recovery unit in satisfactory condition.   [name] MD

## 2022-12-17 ENCOUNTER — Encounter (HOSPITAL_COMMUNITY): Payer: Self-pay | Admitting: Urology

## 2022-12-17 ENCOUNTER — Other Ambulatory Visit (HOSPITAL_COMMUNITY): Payer: Self-pay

## 2022-12-17 DIAGNOSIS — C61 Malignant neoplasm of prostate: Secondary | ICD-10-CM | POA: Diagnosis not present

## 2022-12-17 LAB — HEMOGLOBIN AND HEMATOCRIT, BLOOD
HCT: 41.4 % (ref 39.0–52.0)
Hemoglobin: 13.1 g/dL (ref 13.0–17.0)

## 2022-12-17 MED ORDER — ACETAMINOPHEN 500 MG PO TABS
1000.0000 mg | ORAL_TABLET | Freq: Four times a day (QID) | ORAL | Status: DC
  Administered 2022-12-17: 1000 mg via ORAL
  Filled 2022-12-17: qty 2

## 2022-12-17 MED ORDER — BISACODYL 10 MG RE SUPP
10.0000 mg | Freq: Once | RECTAL | Status: AC
  Administered 2022-12-17: 10 mg via RECTAL
  Filled 2022-12-17: qty 1

## 2022-12-17 MED ORDER — OXYCODONE HCL 5 MG PO TABS: 5.0000 mg | ORAL_TABLET | ORAL | Status: DC | PRN

## 2022-12-17 MED ORDER — HYDROCODONE-ACETAMINOPHEN 5-325 MG PO TABS
1.0000 | ORAL_TABLET | Freq: Four times a day (QID) | ORAL | 0 refills | Status: AC | PRN
Start: 2022-12-17 — End: ?
  Filled 2022-12-17: qty 20, 5d supply, fill #0

## 2022-12-17 MED ORDER — HYDROCODONE-ACETAMINOPHEN 5-325 MG PO TABS: 1.0000 | ORAL_TABLET | Freq: Four times a day (QID) | ORAL | Status: DC | PRN

## 2022-12-17 MED ORDER — OXYCODONE HCL 5 MG PO TABS: 10.0000 mg | ORAL_TABLET | ORAL | Status: DC | PRN

## 2022-12-17 NOTE — Discharge Summary (Signed)
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 Briefly, Edward Fowle. is a 52 y.o. year old patient with prostate cancer. He underwent RALP and BPLND on 12/16/22.   Hospital Course: Patient tolerated the procedure well.  He was then transferred to the floor after an uneventful PACU stay.  His hospital course was uncomplicated.  On POD#1 he had met discharge criteria: was eating a regular diet, was up and ambulating independently,  pain was well controlled,and was ready to for discharge. JP drain was removed prior to discharge.   Laboratory values:  Recent Labs    12/16/22 1626 12/17/22 0403  HGB 15.6 13.1  HCT 50.1 41.4   No results for input(s): "NA", "K", "CL", "CO2", "GLUCOSE", "BUN", "CREATININE", "CALCIUM" in the last 72 hours. No results for input(s): "LABPT", "INR" in the last 72 hours. No results for input(s): "LABURIN" in the last 72 hours. Results for orders placed or performed during the hospital encounter of 12/11/22  Surgical pcr screen     Status: None   Collection Time: 12/11/22  9:29 AM   Specimen: Nasal Mucosa; Nasal Swab  Result Value Ref Range Status   MRSA, PCR NEGATIVE NEGATIVE Final   Staphylococcus aureus NEGATIVE NEGATIVE Final    Comment: (NOTE) The Xpert SA Assay (FDA approved for NASAL specimens in patients 54 years of age and older), is one component of a comprehensive surveillance program. It is not intended to diagnose infection  nor to guide or monitor treatment. Performed at [facility], [address] [place], [place] [address]     Physical Exam  Gen: NAD Resp: Satting well on RA Card: Regular rate Abd: Soft, appropriately tender, ND, incision clean dry and intact GU: Foley catheter in place draining urine. Neuro: Alert   Disposition: Home  Discharge instruction: The patient was instructed to be ambulatory but told to refrain from heavy lifting, strenuous activity, or driving.   Discharge medications:  Allergies as of 12/17/2022   No Known Allergies      Medication List     STOP taking these medications    chlorhexidine 4 % external liquid Commonly known as: HIBICLENS   FISH OIL PO   oxyCODONE 5 MG immediate release tablet Commonly known as: Oxy IR/ROXICODONE   polyethylene glycol 17 g packet Commonly known as: MIRALAX / GLYCOLAX   tamsulosin 0.4 MG Caps capsule Commonly known as: FLOMAX       TAKE these medications    ALPRAZolam 0.5 MG tablet Commonly known as: XANAX Take 0.5 mg by mouth 2 (two) times daily as needed for anxiety.   gabapentin 300 MG capsule Commonly known as: NEURONTIN Take 300 mg by mouth 3 (three) times daily.   HYDROcodone-acetaminophen 5-325 MG tablet Commonly known as: NORCO/VICODIN Take 1 tablet by mouth every 8 (eight) hours as needed for moderate pain. What changed: Another medication with the same name was added. Make sure you understand how and when to take each.   HYDROcodone-acetaminophen 5-325 MG tablet Commonly known as: NORCO/VICODIN Take 1 tablet by  mouth every 6 (six) hours as needed. What changed: You were already taking a medication with the same name, and this prescription was added. Make sure you understand how and when to take each.   rosuvastatin 5 MG tablet Commonly known as: CRESTOR Take 5 mg by mouth daily.   sulfamethoxazole-trimethoprim 800-160 MG tablet Commonly known as: BACTRIM DS Take 1 tablet by  mouth 2 (two) times daily. Begin one day prior to return appointment for catheter removal   tiZANidine 2 MG tablet Commonly known as: ZANAFLEX Take 1 tablet (2 mg total) by mouth every 8 (eight) hours as needed for muscle spasms.        Followup:   Follow-up Information     Heloise Purpura, MD Follow up.   Specialty: Urology Why: 12/25/22 @ 12:30 PM Contact information: [address] [place] [address] [phone]

## 2022-12-17 NOTE — Progress Notes (Signed)
Patient ID: Edward Kelley., male   DOB: 1970/04/04, 52 y.o.   MRN: [id number]  1 Day Post-Op Subjective: The patient is doing well.  No nausea or vomiting. Pain is adequately controlled.  Objective: Vital signs in last 24 hours: Temp:  [97.5 F (36.4 C)-98.2 F (36.8 C)] 98.2 F (36.8 C) (10/08 0631) Pulse Rate:  [65-90] 77 (10/08 0631) Resp:  [12-20] 20 (10/08 0631) BP: (118-180)/(81-112) 155/98 (10/08 0631) SpO2:  [96 %-100 %] 98 % (10/08 0631) Weight:  [74.8 kg-78.8 kg] 74.8 kg (10/07 0941)  Intake/Output from previous day: 10/07 0701 - 10/08 0700 In: 2616.1 [P.O.:240; I.V.:2226.1; IV Piggyback:150] Out: 1535 [Urine:1400; Drains:35; Blood:100] Intake/Output this shift: Total I/O In: -  Out: 30 [Drains:30]  Physical Exam:  General: Alert and oriented. CV: RRR Lungs: Clear bilaterally. GI: Soft, Nondistended. Incisions: Clean, dry, and intact Urine: Clear Extremities: Nontender, no erythema, no edema.  Lab Results: Recent Labs    12/16/22 1626 12/17/22 0403  HGB 15.6 13.1  HCT 50.1 41.4      Assessment/Plan: POD# 1 s/p robotic prostatectomy.  1) SL IVF 2) Ambulate, Incentive spirometry 3) Transition to oral pain medication 4) Dulcolax suppository 5) D/C pelvic drain 6) Plan for likely discharge later today   Edward Kelley. MD   LOS: 0 days   [name] 12/17/2022, 7:53 AM

## 2022-12-20 LAB — SURGICAL PATHOLOGY

## 2023-01-06 ENCOUNTER — Encounter: Payer: Self-pay | Admitting: Gastroenterology

## 2023-01-08 ENCOUNTER — Ambulatory Visit: Payer: Medicaid Other | Attending: Urology | Admitting: Physical Therapy

## 2023-01-08 DIAGNOSIS — R269 Unspecified abnormalities of gait and mobility: Secondary | ICD-10-CM | POA: Insufficient documentation

## 2023-01-08 DIAGNOSIS — M6281 Muscle weakness (generalized): Secondary | ICD-10-CM | POA: Insufficient documentation

## 2023-01-08 DIAGNOSIS — R293 Abnormal posture: Secondary | ICD-10-CM | POA: Diagnosis present

## 2023-01-08 NOTE — Therapy (Signed)
OUTPATIENT PHYSICAL THERAPY MALE PELVIC EVALUATION   Patient Name: Edward Kelley. MRN: [id number] DOB:Oct 11, 1970, 52 y.o., male Today's Date: 01/08/2023  END OF SESSION:  PT End of Session - 01/08/23 0852     Visit Number 2    Date for PT Re-Evaluation 04/04/23    Authorization Type medicaid [employer] BLUE    PT Start Time [id number]    PT Stop Time 0928    PT Time Calculation (min) 41 min    Activity Tolerance Patient tolerated treatment well    Behavior During Therapy [facility] for tasks assessed/performed             Past Medical History:  Diagnosis Date   Anxiety    Arthritis    "neck & shoulders" (10/12/2015)   Asthma    Avascular necrosis (HCC)    Bursitis    "neck & shoulders"   Cancer (HCC)    Prostate Ca,  new dx 08-2022   Chronic back pain    "all over" (10/12/2015)   COPD (chronic obstructive pulmonary disease) (HCC)    Dental disease    advanced/notes 10/12/2015   GERD (gastroesophageal reflux disease)    Headache    "weekly" (10/12/2015)   History of kidney stones    Hypertension    no meds   Migraine    "weekly" (10/12/2015)   Neuromuscular disorder (HCC)    sciatica   Pre-diabetes    Past Surgical History:  Procedure Laterality Date   HERNIA REPAIR Right    did with appendectomy   LAPAROSCOPIC APPENDECTOMY N/A 10/12/2015   Procedure: APPENDECTOMY LAPAROSCOPIC;  Surgeon: Gaynelle Adu, MD;  Location: [facility] OR;  Service: General;  Laterality: N/A;   LYMPHADENECTOMY Bilateral 12/16/2022   Procedure: BILATERAL PELVIC LYMPHADENECTOMY;  Surgeon: Heloise Purpura, MD;  Location: [facility] ORS;  Service: Urology;  Laterality: Bilateral;   ROBOT ASSISTED LAPAROSCOPIC RADICAL PROSTATECTOMY N/A 12/16/2022   Procedure: XI ROBOTIC ASSISTED LAPAROSCOPIC RADICAL PROSTATECTOMY LEVEL 2;  Surgeon: Heloise Purpura, MD;  Location: [facility] ORS;  Service: Urology;  Laterality: N/A;   TOTAL HIP ARTHROPLASTY Left 10/08/2022   Procedure: TOTAL HIP ARTHROPLASTY ANTERIOR APPROACH;  Surgeon: Durene Romans,  MD;  Location: [facility] ORS;  Service: Orthopedics;  Laterality: Left;   WISDOM TOOTH EXTRACTION     Patient Active Problem List   Diagnosis Date Noted   Prostate cancer (HCC) 12/16/2022   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 Since surgery still has abdominal pain, reports almost no ability to stop urine and wearing diapers worse with walking. Does have urges to empty but frequent right now. Has been peeing blood and passing clots. Reports he let let surgeon know they told him to let PCP or urologist know. But can't get in contact with anyone.  Night time voids are about the same, every hours, during the day every 30 mins. sneezing/laughing/coughing also has full losses of urine. During the day, changing pads/pull ups every hour at work 2 hours at home, usually once at night when waking.   Fluid intake: water - "not really much" 16-24oz; teas/sodas/coffee for most of day  PAIN:  Are you having pain? No - not with bladder or prostate but does have Lt hip pain post replacement. Constant but worse with prolonged standing and walking. Limits home care/laundry/cleaning. Worse at the end of the day, 0-8/10 does have some numbness at upper lateral Lt thigh   PRECAUTIONS: Other: active prostate cancer; fall  RED FLAGS: None   WEIGHT BEARING  RESTRICTIONS: No  FALLS:  Has patient fallen in last 6 months? Yes. Number of falls after hip replacement reports falls (unsure exactly how many but a couple)  LIVING ENVIRONMENT: Lives with: lives alone Lives in: House/apartment Stairs:  3 STE handrails on both side, ramp Has following equipment at home: Single point cane  OCCUPATION: writing tudor   PLOF: Independent  PATIENT GOALS: to not need the catheter for long  PERTINENT HISTORY:  See above Sexual abuse: no   BOWEL MOVEMENT: Pain with bowel movement: No Type of bowel movement:Type (Bristol Stool Scale) 4, Frequency daily, and Strain No Fully empty rectum: Yes:   Leakage: No Pads: No Fiber supplement: No  URINATION: Pain with urination: No Fully empty bladder: Yes:   Stream: Strong Urgency: Yes:   Frequency: around 2-3 hours;  nightly 4-5x Leakage:  rarely but could have leakage  Pads: No  INTERCOURSE: Pain with intercourse:  not active Climax: not active    OBJECTIVE:   DIAGNOSTIC FINDINGS:    PATIENT SURVEYS:    PFIQ-7 UIQ-7 =14  COGNITION: Overall cognitive status: Within functional limits for tasks assessed     SENSATION: Light touch: Appears intact Proprioception: Appears intact  MUSCLE LENGTH: Bil hamstrings and adductors limited by 50% Lt>Rt.    FUNCTIONAL TESTS:  5 times sit to stand: 21s Five times Sit to Stand Test (FTSS) Method: Use a straight back chair with a solid seat that is 16-18" high. Ask participant to sit on the chair with arms folded across their chest.   Instructions: "Stand up and sit down as quickly as possible 5 times, keeping your arms folded across your chest."   Measurement: Stop timing when the participant stands the 5th time  Times > 13.6 seconds is associated with increased disability and morbidity (Guralnik, 2000) Times > 15 seconds is predictive of recurrent falls in healthy individuals aged 51 and older (Buatois, et al., 2008) Normal performance  values in community dwelling individuals aged 7 and older (Bohannon, 2006): 60-69 years: 11.4 seconds 70-79 years: 12.6 seconds 80-89 years: 14.8 seconds   GAIT: Distance walked: 150' Assistive device utilized: Single point cane Level of assistance: Modified independence Comments: decreased cadence, decreased stride length, decreased bil step height but worse on Lt side, forward flexed posture and head carriage  POSTURE: rounded shoulders, forward head, and posterior pelvic tilt  PELVIC ALIGNMENT:  Lt anterior rotation  LUMBARAROM/PROM:  A/PROM A/PROM  eval  Flexion Limited by 50%  Extension WFL  Right lateral flexion Limited by 75%  Left lateral flexion Limited by 75%  Right rotation Limited by 50%  Left rotation Limited by 50%   (Blank rows = not tested)  LOWER EXTREMITY AROM/PROM:  [facility]  LOWER EXTREMITY MMT:  Lt hip grossly 3+/5; Rt hip grossly 4/5; knees 4+/5  PALPATION: GENERAL Tightness noted in lumbar and thoracic paraspinals and gluteals, TTP at anterior Lt hip slightly and bil piriformis              External Perineal Exam deferred               Internal Pelvic Floor deferred  Patient confirms identification and approves PT to assess internal pelvic floor and treatment No  PELVIC MMT:   MMT eval  Internal Anal Sphincter   External Anal Sphincter   Puborectalis   Diastasis Recti   (Blank rows = not tested)  TONE: Deferred   TODAY'S TREATMENT:                                                                                                                              DATE:   12/03/22 EVAL Examination completed, findings reviewed, pt educated on POC, HEP. Pt motivated to participate in PT and agreeable to attempt recommendations.    01/08/23   Educated on updated HEP - completed 1 rep of all with good technique Educated on urge drill and bladder irritants and hand out given Educated on bladder retraining and timed voids to initiate improved bladder  habits.  Pt declined internal at this time  PATIENT EDUCATION:  Education details: [id number] Person educated: Patient Education method: Explanation, Demonstration, Tactile cues, Verbal cues, and Handouts Education comprehension: verbalized understanding and returned demonstration  HOME EXERCISE PROGRAM: [id number]  ASSESSMENT:  CLINICAL IMPRESSION: Patient presents to first treatment session and status post RALP and BPLND on 12/16/22. Pt reports increased urinary incontinence and frequency, as well as some skin irritation. Pt also states he has had bleeding in urine and clots has let surgeon and urology office made aware. Pt educated on recommendations to initiate bladder retraining and details above. All handouts reviewed with pt and he denied questions. Motivated to participate. Pt would benefit from additional PT to further address deficits.     OBJECTIVE IMPAIRMENTS: decreased activity tolerance, decreased balance, decreased mobility, difficulty walking, decreased strength, increased fascial restrictions, increased muscle spasms, impaired flexibility, improper body mechanics, postural dysfunction, and pain.   ACTIVITY LIMITATIONS: continence  PARTICIPATION LIMITATIONS: community activity, occupation, and yard work  PERSONAL FACTORS: Time since onset of injury/illness/exacerbation and 1 comorbidity: medical history  are also affecting patient's functional outcome.   REHAB POTENTIAL: Good  CLINICAL DECISION MAKING: Evolving/moderate complexity  EVALUATION COMPLEXITY: Moderate   GOALS: Goals reviewed with patient? Yes  SHORT TERM GOALS: Target date: 12/31/22  Pt to be I with HEP.  Baseline: Goal status: INITIAL  2.  Pt to report improved urinary frequency to no sooner than every 2 hours at least 50% of the time for bladder training after surgery.  Baseline:  Goal status: INITIAL  3.  Pt to demonstrate at least 4/5 bil hip strength for improved pelvic stability and gait  mechanics for ambulating to/from bathroom safely..  Baseline:  Goal status: INITIAL   LONG TERM GOALS: Target date: 04/04/23  Pt to be I with advanced HEP.  Baseline:  Goal status: INITIAL  2.  Pt to report improved time between bladder voids to at least 3 hours for improved QOL with decreased urinary frequency at least 75% of the time.   Baseline:  Goal status: INITIAL  3.  Pt to demonstrate at least 4/5 pelvic floor strength for improved pelvic stability and decreased strain at pelvic floor/ decrease leakage.  Baseline:  Goal status: INITIAL  4.  Pt will be independent with the knack, urge suppression technique, and double voiding in order to improve bladder habits and decrease urinary incontinence.   Baseline:  Goal status: INITIAL  5.  Pt to demonstrate improved coordination of pelvic floor and breathing mechanics with 10# squat with appropriate synergistic patterns to decrease pain and leakage at least 75% of the time.    Baseline:  Goal status: INITIAL  6.  Pt will be able to functional actions such as walking 30 mins without leakage for improved QOL and improved tolerance to activity and bladder habits Baseline:  Goal status: INITIAL   PLAN:  PT FREQUENCY: 1x/week  PT DURATION:  8 sessions  PLANNED INTERVENTIONS: Therapeutic exercises, Therapeutic activity, Neuromuscular re-education, Balance training, Gait training, Patient/Family education, Self Care, Joint mobilization, scar mobilization, Taping, Vasopneumatic device, Biofeedback, Manual therapy, and Re-evaluation  PLAN FOR NEXT SESSION: internal if needed and pt consents, breathing mechanics, core and hip strengthening, coordination of pelvic floor and breathing and activity, urge suppression, bladder retraining, knack   Otelia Sergeant, PT, DPT 10/30/249:33 AM

## 2023-01-08 NOTE — Patient Instructions (Signed)
Urge Incontinence  Ideal urination frequency is every 2-4 wakeful hours, which equates to 5-8 times within a 24-hour period.   Urge incontinence is leakage that occurs when the bladder muscle contracts, creating a sudden need to go before getting to the bathroom.   Going too often when your bladder isn't actually full can disrupt the body's automatic signals to store and hold urine longer, which will increase urgency/frequency.  In this case, the bladder "is running the show" and strategies can be learned to retrain this pattern.   One should be able to control the first urge to urinate, at around 150mL.  The bladder can hold up to a "grande latte," or 400mL. To help you gain control, practice the Urge Drill below when urgency strikes.  This drill will help retrain your bladder signals and allow you to store and hold urine longer.  The overall goal is to stretch out your time between voids to reach a more manageable voiding schedule.    Practice your "quick flicks" often throughout the day (each waking hour) even when you don't need feel the urge to go.  This will help strengthen your pelvic floor muscles, making them more effective in controlling leakage.  Urge Drill  When you feel an urge to go, follow these steps to regain control: Stop what you are doing and be still Take one deep breath, directing your air into your abdomen Think an affirming thought, such as "I've got this." Do 5 quick flicks of your pelvic floor Walk with control to the bathroom to void, or delay voiding   Bladder Irritants  Certain foods and beverages can be irritating to the bladder.  Avoiding these irritants may decrease your symptoms of urinary urgency, frequency or bladder pain.  Even reducing your intake can help with your symptoms.  Not everyone is sensitive to all bladder irritants, so you may consider focusing on one irritant at a time, removing or reducing your intake of that irritant for 7-10 days to see if  this change helps your symptoms.  Water intake is also very important.  Below is a list of bladder irritants.  Drinks: alcohol, carbonated beverages, caffeinated beverages such as coffee and tea, drinks with artificial sweeteners, citrus juices, apple juice, tomato juice  Foods: tomatoes and tomato based foods, spicy food, sugar and artificial sweeteners, vinegar, chocolate, raw onion, apples, citrus fruits, pineapple, cranberries, tomatoes, strawberries, plums, peaches, cantaloupe  Other: acidic urine (too concentrated) - see water intake info below  Substitutes you can try that are NOT irritating to the bladder: cooked onion, pears, papayas, sun-brewed decaf teas, watermelons, non-citrus herbal teas, apricots, kava and low-acid instant drinks (Postum).    WATER INTAKE: Remember to drink lots of water (aim for fluid intake of half your body weight with 2/3 of fluids being water).  You may be limiting fluids due to fear of leakage, but this can actually worsen urgency symptoms due to highly concentrated urine.  Water helps balance the pH of your urine so it doesn't become too acidic - acidic urine is a bladder irritant!    

## 2023-01-13 ENCOUNTER — Ambulatory Visit: Payer: Medicaid Other | Admitting: Physical Therapy

## 2023-01-22 ENCOUNTER — Ambulatory Visit: Payer: Medicaid Other | Attending: Urology | Admitting: Physical Therapy

## 2023-01-22 ENCOUNTER — Telehealth: Payer: Self-pay | Admitting: Physical Therapy

## 2023-01-22 DIAGNOSIS — R293 Abnormal posture: Secondary | ICD-10-CM | POA: Insufficient documentation

## 2023-01-22 DIAGNOSIS — M6281 Muscle weakness (generalized): Secondary | ICD-10-CM | POA: Insufficient documentation

## 2023-01-22 DIAGNOSIS — R269 Unspecified abnormalities of gait and mobility: Secondary | ICD-10-CM | POA: Insufficient documentation

## 2023-01-22 NOTE — Telephone Encounter (Signed)
PT called pt about this morning's appointment at 1145. Pt did not answer, voicemail left.    Otelia Sergeant, PT, DPT 01/22/2411:21 PM

## 2023-01-30 ENCOUNTER — Ambulatory Visit: Payer: Medicaid Other | Admitting: Physical Therapy

## 2023-01-30 DIAGNOSIS — R269 Unspecified abnormalities of gait and mobility: Secondary | ICD-10-CM | POA: Diagnosis present

## 2023-01-30 DIAGNOSIS — R293 Abnormal posture: Secondary | ICD-10-CM

## 2023-01-30 DIAGNOSIS — M6281 Muscle weakness (generalized): Secondary | ICD-10-CM

## 2023-01-30 NOTE — Patient Instructions (Signed)
Penile Pump Protocol ?Prior to placing pump on penis, retract the foreskin and shave the pubic hair ?Place water based lubricant only on the ring.  ?Place pump over the penis to the base.  ?Use slow gentle compressions till erection ?Maintain erection for 5 seconds then release, So for 10-20 erections for 10 min per day for 4 weeks ?Then progress to  3 minutes on, 1 minute off, 3 repetitions of this sequence for 3 times per week up to 12 months ?For post-prostatectomy, use as early as 4 weeks post-operatively, up to 12 months ? ?Device may be covered by insurance if billed for "penile rehabilitation"  ?The device is to stretch and increase strength of penis ?You Tube vides "Physiotherapy Penile Rehab for Erectile Dysfunction ( use of vacuum pump) ? ?Types of devices ?Penile pumps ? ?Vacurect Vacuum Therapy Erectile Dysfunction Device  by Vacurect ?Can get on [place] ? ?Active Erection System ?www.FailLinks.co.uk ?  ? ?Clamp Protocol ?1.  Fit penile clamp-Weisner or Dribblestop ?2. Wear all day  during the waking hours ONLY. Do not wear when sleeping. ?3. Place clamp just before the glans penis (head of penis) ?4. Wear all day, minimum of 2 hours between emptying your bladder  ?5. Wear 6 days/week, with one day off to re-assess ?6. Bladder Chart on the day off ?7. On day off see how long you can go without leaking but continue urinating 2 hours  ?8. When weaning off the clamp wear a pad just in case of leakage. ?9. Wear 4-6 weeks, , wean off pads ?10. May use in combination of medication that doctor may give you ? ? ? ? ? ? ? ?

## 2023-01-30 NOTE — Therapy (Signed)
OUTPATIENT PHYSICAL THERAPY MALE PELVIC Treatment   Patient Name: Edward Kelley. MRN: [id number] DOB:September 13, 1970, 52 y.o., male Today's Date: 01/30/2023  END OF SESSION:  PT End of Session - 01/30/23 1619     Visit Number 3    Date for PT Re-Evaluation 04/04/23    Authorization Type medicaid [employer] BLUE    PT Start Time 1618    PT Stop Time 1657    PT Time Calculation (min) 39 min    Activity Tolerance Patient tolerated treatment well    Behavior During Therapy WFL for tasks assessed/performed             Past Medical History:  Diagnosis Date   Anxiety    Arthritis    "neck & shoulders" (10/12/2015)   Asthma    Avascular necrosis (HCC)    Bursitis    "neck & shoulders"   Cancer (HCC)    Prostate Ca,  new dx 08-2022   Chronic back pain    "all over" (10/12/2015)   COPD (chronic obstructive pulmonary disease) (HCC)    Dental disease    advanced/notes 10/12/2015   GERD (gastroesophageal reflux disease)    Headache    "weekly" (10/12/2015)   History of kidney stones    Hypertension    no meds   Migraine    "weekly" (10/12/2015)   Neuromuscular disorder (HCC)    sciatica   Pre-diabetes    Past Surgical History:  Procedure Laterality Date   HERNIA REPAIR Right    did with appendectomy   LAPAROSCOPIC APPENDECTOMY N/A 10/12/2015   Procedure: APPENDECTOMY LAPAROSCOPIC;  Surgeon: Gaynelle Adu, MD;  Location: [facility] OR;  Service: General;  Laterality: N/A;   LYMPHADENECTOMY Bilateral 12/16/2022   Procedure: BILATERAL PELVIC LYMPHADENECTOMY;  Surgeon: Heloise Purpura, MD;  Location: [facility] ORS;  Service: Urology;  Laterality: Bilateral;   ROBOT ASSISTED LAPAROSCOPIC RADICAL PROSTATECTOMY N/A 12/16/2022   Procedure: XI ROBOTIC ASSISTED LAPAROSCOPIC RADICAL PROSTATECTOMY LEVEL 2;  Surgeon: Heloise Purpura, MD;  Location: [facility] ORS;  Service: Urology;  Laterality: N/A;   TOTAL HIP ARTHROPLASTY Left 10/08/2022   Procedure: TOTAL HIP ARTHROPLASTY ANTERIOR APPROACH;  Surgeon: Durene Romans,  MD;  Location: [facility] ORS;  Service: Orthopedics;  Laterality: Left;   WISDOM TOOTH EXTRACTION     Patient Active Problem List   Diagnosis Date Noted   Prostate cancer (HCC) 12/16/2022   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 Does report he thinks urine leakage is getting better usually has smaller leakage with getting to bathroom. Does have larger loss of urine with cough/sneezing. Was using 4 diapers at work and now using 2 (5 hour shift). Usually getting up 2-3 night. Now around hour during the day and goes to the bathroom with an urge and now making it 90% of the time but if can't go when he gets the urge will have leakage. Leakage with mobility and coughing/sneezing.   Fluid intake: water - "not really much" 16-24oz; teas/sodas/coffee for most of day  PAIN:  Are you having pain? No - not with bladder or prostate but does have Lt hip pain post replacement. Constant but worse with prolonged standing and walking. Limits home care/laundry/cleaning. Worse at the end of the day, 0-8/10 does have some numbness at upper lateral Lt thigh   PRECAUTIONS: Other: active prostate cancer; fall  RED FLAGS: None   WEIGHT BEARING RESTRICTIONS: No  FALLS:  Has patient fallen in last 6 months? Yes.  Number of falls after hip replacement reports falls (unsure exactly how many but a couple)  LIVING ENVIRONMENT: Lives with: lives alone Lives in: House/apartment Stairs:  3 STE handrails on both side, ramp Has following equipment at home: Single point cane  OCCUPATION: writing tudor   PLOF: Independent  PATIENT GOALS: to not need the catheter for long  PERTINENT HISTORY:  See above Sexual abuse: no   BOWEL MOVEMENT: Pain with bowel movement: No Type of bowel movement:Type (Bristol Stool Scale) 4, Frequency daily, and Strain No Fully empty rectum: Yes:   Leakage: No Pads: No Fiber supplement: No  URINATION: Pain with urination: No Fully empty bladder: Yes:   Stream: Strong Urgency: Yes:   Frequency: around 2-3 hours;  nightly 4-5x Leakage:  rarely but could have leakage  Pads: No  INTERCOURSE: Pain with intercourse:  not active Climax: not active    OBJECTIVE:   DIAGNOSTIC FINDINGS:    PATIENT SURVEYS:    PFIQ-7 UIQ-7 =14  COGNITION: Overall cognitive status: Within functional limits for tasks assessed     SENSATION: Light touch: Appears intact Proprioception: Appears intact  MUSCLE LENGTH: Bil hamstrings and adductors limited by 50% Lt>Rt.    FUNCTIONAL TESTS:  5 times sit to stand: 21s Five times Sit to Stand Test (FTSS) Method: Use a straight back chair with a solid seat that is 16-18" high. Ask participant to sit on the chair with arms folded across their chest.   Instructions: "Stand up and sit down as quickly as possible 5 times, keeping your arms folded across your chest."   Measurement: Stop timing when the participant stands the 5th time  Times > 13.6 seconds is associated with increased disability and morbidity (Guralnik, 2000) Times > 15 seconds is predictive of recurrent falls in healthy individuals aged 47 and older (Buatois, et al., 2008) Normal performance values in community dwelling individuals aged 37 and older (Bohannon,  2006): 60-69 years: 11.4 seconds 70-79 years: 12.6 seconds 80-89 years: 14.8 seconds   GAIT: Distance walked: 150' Assistive device utilized: Single point cane Level of assistance: Modified independence Comments: decreased cadence, decreased stride length, decreased bil step height but worse on Lt side, forward flexed posture and head carriage  POSTURE: rounded shoulders, forward head, and posterior pelvic tilt  PELVIC ALIGNMENT: Lt anterior rotation  LUMBARAROM/PROM:  A/PROM A/PROM  eval  Flexion Limited by 50%  Extension WFL  Right lateral flexion Limited by 75%  Left lateral flexion Limited by 75%  Right rotation Limited by 50%  Left rotation Limited by 50%   (Blank rows = not tested)  LOWER EXTREMITY AROM/PROM:  [facility]  LOWER EXTREMITY MMT:  Lt hip grossly 3+/5; Rt hip grossly 4/5; knees 4+/5  PALPATION: GENERAL Tightness noted in lumbar and thoracic paraspinals and gluteals, TTP at anterior Lt hip slightly and bil piriformis              External Perineal Exam deferred               Internal Pelvic Floor deferred  Patient confirms identification and approves PT to assess internal pelvic floor and treatment No  PELVIC MMT:   MMT eval  Internal Anal Sphincter   External Anal Sphincter   Puborectalis   Diastasis Recti   (Blank rows = not tested)  TONE: Deferred   TODAY'S TREATMENT:                                                                                                                              DATE:   12/03/22 EVAL Examination completed, findings reviewed, pt educated on POC, HEP. Pt motivated to participate in PT and agreeable to attempt recommendations.    01/08/23   Educated on updated HEP - completed 1 rep of all with good technique Educated on urge drill and bladder irritants and hand out given Educated on bladder retraining and timed voids to initiate improved bladder habits.  Pt declined internal at this time  01/30/23:  Scar massage  initiated to abdominal scar sites with gentle rt/lt and up/down direction on either side of scars not directly over scar sites. Pt educated on this as well and verbalized understanding.  Educated to attempt 1 building to 1.5 hour timed bladder retraining Educated on towel roll with pelvic floor contractions practice to improve sensation/awareness of pelvic floor contractions in sitting. Attempted 2x10 in session pt reports this made it easier for him to feel  Pt had questions about erection ability and states this has not improved educated to ask MD about medical options, also encouraged to ask about penile pump. Penile clamp also educated on for improved leakage between voids.  Pt also educated on continuing HEP and states he completes this.   PATIENT EDUCATION:  Education details: [id number] Person educated: Patient Education method: Explanation, Demonstration, Tactile cues, Verbal cues, and Handouts Education comprehension: verbalized understanding and returned demonstration  HOME EXERCISE PROGRAM: [id number]  ASSESSMENT:  CLINICAL IMPRESSION: Patient presents to first treatment session focus on abdominal scar mobility, bladder retraining and pelvic floor contraction techniques for improved awareness of activation and strengthening. Pt tolerated well and denied additional questions at end of session. Plan to progress session with more pelvic floor strengthening and initiate coordination of pelvic floor with activity. Pt would benefit from additional PT to further address deficits.  OBJECTIVE IMPAIRMENTS: decreased activity tolerance, decreased balance, decreased mobility, difficulty walking, decreased strength, increased fascial restrictions, increased muscle spasms, impaired flexibility, improper body mechanics, postural dysfunction, and pain.   ACTIVITY LIMITATIONS: continence  PARTICIPATION LIMITATIONS: community activity, occupation, and yard work  PERSONAL FACTORS: Time since onset  of injury/illness/exacerbation and 1 comorbidity: medical history  are also affecting patient's functional outcome.   REHAB POTENTIAL: Good  CLINICAL DECISION MAKING: Evolving/moderate complexity  EVALUATION COMPLEXITY: Moderate   GOALS: Goals reviewed with patient? Yes  SHORT TERM GOALS: Target date: 12/31/22  Pt to be I with HEP.  Baseline: Goal status: INITIAL  2.  Pt to report improved urinary frequency to no sooner than every 2 hours at least 50% of the time for bladder training after surgery.  Baseline:  Goal status: INITIAL  3.  Pt to demonstrate at least 4/5 bil hip strength for improved pelvic stability and gait mechanics for ambulating to/from bathroom safely..  Baseline:  Goal status: INITIAL   LONG TERM GOALS: Target date: 04/04/23  Pt to be I with advanced HEP.  Baseline:  Goal status: INITIAL  2.  Pt to report improved time between bladder voids to at least 3 hours for improved QOL with decreased urinary frequency at least 75% of the time.   Baseline:  Goal status: INITIAL  3.  Pt to demonstrate at least 4/5 pelvic floor strength for improved pelvic stability and decreased strain at pelvic floor/ decrease leakage.  Baseline:  Goal status: INITIAL  4.  Pt will be independent with the knack, urge suppression technique, and double voiding in order to improve bladder habits and decrease urinary incontinence.   Baseline:  Goal status: INITIAL  5.  Pt to demonstrate improved coordination of pelvic floor and breathing mechanics with 10# squat with appropriate synergistic patterns to decrease pain and leakage at least 75% of the time.    Baseline:  Goal status: INITIAL  6.  Pt will be able to functional actions such as walking 30 mins without leakage for improved QOL and improved tolerance to activity and bladder habits Baseline:  Goal status: INITIAL   PLAN:  PT FREQUENCY: 1x/week  PT DURATION:  8 sessions  PLANNED INTERVENTIONS: Therapeutic  exercises, Therapeutic activity, Neuromuscular re-education, Balance training, Gait training, Patient/Family education, Self Care, Joint mobilization, scar mobilization, Taping, Vasopneumatic device, Biofeedback, Manual therapy, and Re-evaluation  PLAN FOR NEXT SESSION: internal if needed and pt consents, breathing mechanics, core and hip strengthening, coordination of pelvic floor and breathing and activity, urge suppression, bladder retraining, knack   Otelia Sergeant, PT, DPT 11/21/245:02 PM

## 2023-02-24 ENCOUNTER — Telehealth: Payer: Self-pay | Admitting: Physical Therapy

## 2023-02-24 ENCOUNTER — Ambulatory Visit: Payer: Medicaid Other | Attending: Urology | Admitting: Physical Therapy

## 2023-02-24 DIAGNOSIS — R293 Abnormal posture: Secondary | ICD-10-CM | POA: Insufficient documentation

## 2023-02-24 DIAGNOSIS — R269 Unspecified abnormalities of gait and mobility: Secondary | ICD-10-CM | POA: Insufficient documentation

## 2023-02-24 DIAGNOSIS — M6281 Muscle weakness (generalized): Secondary | ICD-10-CM | POA: Insufficient documentation

## 2023-02-24 NOTE — Telephone Encounter (Signed)
PT called pt about this morning's appointment at 1615. Pt did not answer, voicemail left.    Otelia Sergeant, PT, DPT 12/16/244:28 PM

## 2023-10-06 ENCOUNTER — Encounter: Payer: Self-pay | Admitting: Family Medicine

## 2023-10-06 ENCOUNTER — Other Ambulatory Visit: Payer: Self-pay | Admitting: Family Medicine

## 2023-10-06 DIAGNOSIS — Z122 Encounter for screening for malignant neoplasm of respiratory organs: Secondary | ICD-10-CM

## 2023-10-09 ENCOUNTER — Inpatient Hospital Stay
Admission: RE | Admit: 2023-10-09 | Discharge: 2023-10-09 | Discharge status: Home / Self Care | Source: Ambulatory Visit | Attending: Family Medicine | Admitting: Family Medicine

## 2023-10-09 DIAGNOSIS — Z122 Encounter for screening for malignant neoplasm of respiratory organs: Secondary | ICD-10-CM
